# Patient Record
Sex: Female | Born: 1945 | Race: White | Hispanic: No | Marital: Married | Smoking: Former smoker
Health system: Southern US, Community
[De-identification: ages and names within clinical notes are randomized; demographics above are authoritative.]

## PROBLEM LIST (undated history)

## (undated) DIAGNOSIS — D649 Anemia, unspecified: Secondary | ICD-10-CM

## (undated) DIAGNOSIS — S42309A Unspecified fracture of shaft of humerus, unspecified arm, initial encounter for closed fracture: Secondary | ICD-10-CM

## (undated) DIAGNOSIS — J45909 Unspecified asthma, uncomplicated: Secondary | ICD-10-CM

## (undated) DIAGNOSIS — Z9289 Personal history of other medical treatment: Secondary | ICD-10-CM

## (undated) DIAGNOSIS — I10 Essential (primary) hypertension: Secondary | ICD-10-CM

## (undated) DIAGNOSIS — S92901A Unspecified fracture of right foot, initial encounter for closed fracture: Secondary | ICD-10-CM

## (undated) DIAGNOSIS — R0600 Dyspnea, unspecified: Secondary | ICD-10-CM

## (undated) HISTORY — PX: COLONOSCOPY: SHX174

## (undated) HISTORY — PX: WRIST ARTHROSCOPY: SUR100

## (undated) HISTORY — PX: EYE SURGERY: SHX253

## (undated) HISTORY — PX: LASIK: SHX215

## (undated) HISTORY — PX: MENISCUS REPAIR: SHX5179

## (undated) HISTORY — PX: ABDOMINAL HYSTERECTOMY: SHX81

---

## 1978-05-24 HISTORY — PX: ANKLE FRACTURE SURGERY: SHX122

## 2001-10-30 ENCOUNTER — Ambulatory Visit (HOSPITAL_BASED_OUTPATIENT_CLINIC_OR_DEPARTMENT_OTHER): Admission: RE | Admit: 2001-10-30 | Discharge: 2001-10-30 | Payer: Self-pay | Admitting: Orthopedic Surgery

## 2018-02-02 ENCOUNTER — Other Ambulatory Visit: Payer: Self-pay | Admitting: Orthopedic Surgery

## 2018-02-09 NOTE — Patient Instructions (Addendum)
Denise Ortega  02/09/2018   Your procedure is scheduled on: 02-22-18   [facility]tr[facility]    Report to admitting at 10:45 AM    Call this number if you have problems the morning of surgery [phone]     Remember: Do not eat food or drink liquids :After Midnight. You may have a Clear Liquid Diet from Midnight until 7:15 AM. After 7:15 AM, nothing until after surgery.    BRUSH YOUR TEETH MORNING OF SURGERY AND RINSE YOUR MOUTH OUT, NO CHEWING GUM CANDY OR MINTS.      CLEAR LIQUID DIET   Foods Allowed                                                                     Foods Excluded  Coffee and tea, regular and decaf                             liquids that you cannot  Plain Jell-O in any flavor                                             see through such as: Fruit ices (not with fruit pulp)                                     milk, soups, orange juice  Iced Popsicles                                    All solid food Carbonated beverages, regular and diet                                    Cranberry, grape and apple juices Sports drinks like Gatorade Lightly seasoned clear broth or consume(fat free) Sugar, honey syrup  Sample Menu Breakfast                                Lunch                                     Supper Cranberry juice                    Beef broth                            Chicken broth Jell-O                                     Grape juice  Apple juice Coffee or tea                        Jell-O                                      Popsicle                                                Coffee or tea                        Coffee or tea  _____________________________________________________________________    Take these medicines the morning of surgery with A SIP OF WATER: You may bring and use your inhaler and eyedrops as needed.                               You may not have any metal  on your body including hair pins and              piercings  Do not wear jewelry, make-up, lotions, powders or perfumes, deodorant             Do not wear nail polish.  Do not shave 48 hours prior to surgery.               Do not bring valuables to the hospital. [facility] IS NOT             RESPONSIBLE   FOR VALUABLES.  Contacts, dentures or bridgework may not be worn into surgery.  Leave suitcase in the car. After surgery it may be brought to your room.  :  Special Instructions: N/A              Please read over the following fact sheets you were given: _____________________________________________________________________ [facility] - Preparing for Surgery  Before surgery, you can play an important role.  Because skin is not sterile, your skin needs to be as free of germs as possible.  You can reduce the number of germs on your skin by washing with CHG (chlorahexidine gluconate) soap before surgery.  CHG is an antiseptic cleaner which kills germs and bonds with the skin to continue killing germs even after washing. Please DO NOT use if you have an allergy to CHG or antibacterial soaps.  If your skin becomes reddened/irritated stop using the CHG and inform your nurse when you arrive at Short Stay. Do not shave (including legs and underarms) for at least 48 hours prior to the first CHG shower.  You may shave your face/neck. Please follow these instructions carefully:  1.  Shower with CHG Soap the night before surgery and the  morning of Surgery.  2.  If you choose to wash your hair, wash your hair first as usual with your  normal  shampoo.  3.  After you shampoo, rinse your hair and body thoroughly to remove the  shampoo.                           4.  Use CHG as you would any other liquid soap.  You can apply chg directly  to the skin and wash                       Gently with a scrungie or clean washcloth.  5.  Apply the CHG Soap to your body ONLY FROM THE NECK DOWN.   Do not use on face/  open                           Wound or open sores. Avoid contact with eyes, ears mouth and genitals (private parts).                       Wash face,  Genitals (private parts) with your normal soap.             6.  Wash thoroughly, paying special attention to the area where your surgery  will be performed.  7.  Thoroughly rinse your body with warm water from the neck down.  8.  DO NOT shower/wash with your normal soap after using and rinsing off  the CHG Soap.                9.  Pat yourself dry with a clean towel.            10.  Wear clean pajamas.            11.  Place clean sheets on your bed the night of your first shower and do not  sleep with pets. Day of Surgery : Do not apply any lotions/deodorants the morning of surgery.  Please wear clean clothes to the hospital/surgery center.  FAILURE TO FOLLOW THESE INSTRUCTIONS MAY RESULT IN THE CANCELLATION OF YOUR SURGERY PATIENT SIGNATURE_________________________________  NURSE SIGNATURE__________________________________  ________________________________________________________________________   Rogelia Mire  An incentive spirometer is a tool that can help keep your lungs clear and active. This tool measures how well you are filling your lungs with each breath. Taking long deep breaths may help reverse or decrease the chance of developing breathing (pulmonary) problems (especially infection) following:  A long period of time when you are unable to move or be active. BEFORE THE PROCEDURE   If the spirometer includes an indicator to show your best effort, your nurse or respiratory therapist will set it to a desired goal.  If possible, sit up straight or lean slightly forward. Try not to slouch.  Hold the incentive spirometer in an upright position. INSTRUCTIONS FOR USE  1. Sit on the edge of your bed if possible, or sit up as far as you can in bed or on a chair. 2. Hold the incentive spirometer in an upright  position. 3. Breathe out normally. 4. Place the mouthpiece in your mouth and seal your lips tightly around it. 5. Breathe in slowly and as deeply as possible, raising the piston or the ball toward the top of the column. 6. Hold your breath for 3-5 seconds or for as long as possible. Allow the piston or ball to fall to the bottom of the column. 7. Remove the mouthpiece from your mouth and breathe out normally. 8. Rest for a few seconds and repeat Steps 1 through 7 at least 10 times every 1-2 hours when you are awake. Take your time and take a few normal breaths between deep breaths. 9. The spirometer may include an indicator to show your best effort. Use the indicator as a goal to work toward during each repetition. 10. After  each set of 10 deep breaths, practice coughing to be sure your lungs are clear. If you have an incision (the cut made at the time of surgery), support your incision when coughing by placing a pillow or rolled up towels firmly against it. Once you are able to get out of bed, walk around indoors and cough well. You may stop using the incentive spirometer when instructed by your caregiver.  RISKS AND COMPLICATIONS  Take your time so you do not get dizzy or light-headed.  If you are in pain, you may need to take or ask for pain medication before doing incentive spirometry. It is harder to take a deep breath if you are having pain. AFTER USE  Rest and breathe slowly and easily.  It can be helpful to keep track of a log of your progress. Your caregiver can provide you with a simple table to help with this. If you are using the spirometer at home, follow these instructions: [place] IF:   You are having difficultly using the spirometer.  You have trouble using the spirometer as often as instructed.  Your pain medication is not giving enough relief while using the spirometer.  You develop fever of 100.5 F (38.1 C) or higher. SEEK IMMEDIATE MEDICAL CARE IF:    You cough up bloody sputum that had not been present before.  You develop fever of 102 F (38.9 C) or greater.  You develop worsening pain at or near the incision site. MAKE SURE YOU:   Understand these instructions.  Will watch your condition.  Will get help right away if you are not doing well or get worse. Document Released: 09/20/2006 Document Revised: 08/02/2011 Document Reviewed: 11/21/2006 Exit[employer] Patient Information 2014 Exit[employer], [place].   ________________________________________________________________________  WHAT IS A BLOOD TRANSFUSION? Blood Transfusion Information  A transfusion is the replacement of blood or some of its parts. Blood is made up of multiple cells which provide different functions.  Red blood cells carry oxygen and are used for blood loss replacement.  White blood cells fight against infection.  Platelets control bleeding.  Plasma helps clot blood.  Other blood products are available for specialized needs, such as hemophilia or other clotting disorders. BEFORE THE TRANSFUSION  Who gives blood for transfusions?   Healthy volunteers who are fully evaluated to make sure their blood is safe. This is blood bank blood. Transfusion therapy is the safest it has ever been in the practice of medicine. Before blood is taken from a donor, a complete history is taken to make sure that person has no history of diseases nor engages in risky social behavior (examples are intravenous drug use or sexual activity with multiple partners). The donor's travel history is screened to minimize risk of transmitting infections, such as malaria. The donated blood is tested for signs of infectious diseases, such as HIV and hepatitis. The blood is then tested to be sure it is compatible with you in order to minimize the chance of a transfusion reaction. If you or a relative donates blood, this is often done in anticipation of surgery and is not appropriate for emergency  situations. It takes many days to process the donated blood. RISKS AND COMPLICATIONS Although transfusion therapy is very safe and saves many lives, the main dangers of transfusion include:   Getting an infectious disease.  Developing a transfusion reaction. This is an allergic reaction to something in the blood you were given. Every precaution is taken to prevent this. The decision to  have a blood transfusion has been considered carefully by your caregiver before blood is given. Blood is not given unless the benefits outweigh the risks. AFTER THE TRANSFUSION  Right after receiving a blood transfusion, you will usually feel much better and more energetic. This is especially true if your red blood cells have gotten low (anemic). The transfusion raises the level of the red blood cells which carry oxygen, and this usually causes an energy increase.  The nurse administering the transfusion will monitor you carefully for complications. HOME CARE INSTRUCTIONS  No special instructions are needed after a transfusion. You may find your energy is better. Speak with your caregiver about any limitations on activity for underlying diseases you may have. SEEK MEDICAL CARE IF:   Your condition is not improving after your transfusion.  You develop redness or irritation at the intravenous (IV) site. SEEK IMMEDIATE MEDICAL CARE IF:  Any of the following symptoms occur over the next 12 hours:  Shaking chills.  You have a temperature by mouth above 102 F (38.9 C), not controlled by medicine.  Chest, back, or muscle pain.  People around you feel you are not acting correctly or are confused.  Shortness of breath or difficulty breathing.  Dizziness and fainting.  You get a rash or develop hives.  You have a decrease in urine output.  Your urine turns a dark color or changes to pink, red, or brown. Any of the following symptoms occur over the next 10 days:  You have a temperature by mouth above  102 F (38.9 C), not controlled by medicine.  Shortness of breath.  Weakness after normal activity.  The white part of the eye turns yellow (jaundice).  You have a decrease in the amount of urine or are urinating less often.  Your urine turns a dark color or changes to pink, red, or brown. Document Released: 05/07/2000 Document Revised: 08/02/2011 Document Reviewed: 12/25/2007 [facility] Patient Information 2014 [place], [place].  _______________________________________________________________________

## 2018-02-14 ENCOUNTER — Encounter (HOSPITAL_COMMUNITY)
Admission: RE | Admit: 2018-02-14 | Discharge: 2018-02-14 | Disposition: A | Discharge status: Home / Self Care | Payer: Medicare Other | Source: Ambulatory Visit | Attending: Orthopedic Surgery | Admitting: Orthopedic Surgery

## 2018-02-14 ENCOUNTER — Ambulatory Visit (HOSPITAL_COMMUNITY)
Admission: RE | Admit: 2018-02-14 | Discharge: 2018-02-14 | Disposition: A | Discharge status: Home / Self Care | Payer: Medicare Other | Source: Ambulatory Visit | Attending: Orthopedic Surgery | Admitting: Orthopedic Surgery

## 2018-02-14 ENCOUNTER — Other Ambulatory Visit: Payer: Self-pay

## 2018-02-14 ENCOUNTER — Encounter (HOSPITAL_COMMUNITY): Payer: Self-pay | Admitting: *Deleted

## 2018-02-14 DIAGNOSIS — Z01818 Encounter for other preprocedural examination: Secondary | ICD-10-CM

## 2018-02-14 HISTORY — DX: Dyspnea, unspecified: R06.00

## 2018-02-14 HISTORY — DX: Unspecified asthma, uncomplicated: J45.909

## 2018-02-14 HISTORY — DX: Anemia, unspecified: D64.9

## 2018-02-14 HISTORY — DX: Essential (primary) hypertension: I10

## 2018-02-14 LAB — BASIC METABOLIC PANEL
Anion gap: 8 (ref 5–15)
BUN: 21 mg/dL (ref 8–23)
CHLORIDE: 108 mmol/L (ref 98–111)
CO2: 28 mmol/L (ref 22–32)
Calcium: 9.6 mg/dL (ref 8.9–10.3)
Creatinine, Ser: 0.78 mg/dL (ref 0.44–1.00)
GFR calc Af Amer: >60 mL/min (ref >60)
GFR calc non Af Amer: >60 mL/min (ref >60)
GLUCOSE: 102 mg/dL — AB (ref 70–99)
Potassium: 3.8 mmol/L (ref 3.5–5.1)
Sodium: 144 mmol/L (ref 135–145)

## 2018-02-14 LAB — URINALYSIS, ROUTINE W REFLEX MICROSCOPIC
Bilirubin Urine: NEGATIVE
GLUCOSE, UA: NEGATIVE mg/dL
Ketones, ur: NEGATIVE mg/dL
Leukocytes, UA: NEGATIVE
NITRITE: NEGATIVE
PH: 6 (ref 5.0–8.0)
Protein, ur: NEGATIVE mg/dL
Specific Gravity, Urine: 1.023 (ref 1.005–1.030)

## 2018-02-14 LAB — CBC WITH DIFFERENTIAL/PLATELET
Basophils Absolute: 0.1 10*3/uL (ref 0.0–0.1)
Basophils Relative: 1 %
EOS PCT: 8 %
Eosinophils Absolute: 0.5 10*3/uL (ref 0.0–0.7)
HCT: 35.5 % — ABNORMAL LOW (ref 36.0–46.0)
Hemoglobin: 11.6 g/dL — ABNORMAL LOW (ref 12.0–15.0)
LYMPHS ABS: 2.1 10*3/uL (ref 0.7–4.0)
LYMPHS PCT: 31 %
MCH: 28 pg (ref 26.0–34.0)
MCHC: 32.7 g/dL (ref 30.0–36.0)
MCV: 85.7 fL (ref 78.0–100.0)
MONO ABS: 0.5 10*3/uL (ref 0.1–1.0)
MONOS PCT: 7 %
Neutro Abs: 3.6 10*3/uL (ref 1.7–7.7)
Neutrophils Relative %: 53 %
Platelets: 292 10*3/uL (ref 150–400)
RBC: 4.14 MIL/uL (ref 3.87–5.11)
RDW: 14.4 % (ref 11.5–15.5)
WBC: 6.7 10*3/uL (ref 4.0–10.5)

## 2018-02-14 LAB — SURGICAL PCR SCREEN
MRSA, PCR: NEGATIVE
Staphylococcus aureus: POSITIVE — AB

## 2018-02-14 LAB — ABO/RH: ABO/RH(D): A POS

## 2018-02-14 LAB — PROTIME-INR
INR: 0.95
Prothrombin Time: 12.6 seconds (ref 11.4–15.2)

## 2018-02-14 LAB — APTT: aPTT: 30 seconds (ref 24–36)

## 2018-02-16 ENCOUNTER — Other Ambulatory Visit: Payer: Self-pay | Admitting: Orthopedic Surgery

## 2018-02-16 NOTE — Care Plan (Signed)
Patient lives in [place] with family. She plans to discharge back there. I have arranged for the [facility] there to provide her HHPT and she will have her OPPT at [facility] OP. She is aware and agreeable to all arrangements.   Thanks

## 2018-02-21 DIAGNOSIS — M1711 Unilateral primary osteoarthritis, right knee: Secondary | ICD-10-CM | POA: Diagnosis present

## 2018-02-21 MED ORDER — TRANEXAMIC ACID 1000 MG/10ML IV SOLN
2000.0000 mg | INTRAVENOUS | Status: DC
  Filled 2018-02-21: qty 20

## 2018-02-21 MED ORDER — BUPIVACAINE LIPOSOME 1.3 % IJ SUSP
20.0000 mL | INTRAMUSCULAR | Status: DC
  Filled 2018-02-21: qty 20

## 2018-02-21 NOTE — H&P (Signed)
TOTAL KNEE ADMISSION H&P  Patient is being admitted for right total knee arthroplasty.  Subjective:  Chief Complaint:right knee pain.  HPI: Denise Ortega, 72 y.o. female, has a history of pain and functional disability in the right knee due to arthritis and has failed non-surgical conservative treatments for greater than 12 weeks to includeNSAID's and/or analgesics, corticosteriod injections, flexibility and strengthening excercises, weight reduction as appropriate and activity modification.  Onset of symptoms was gradual, starting 3 years ago with gradually worsening course since that time. The patient noted no past surgery on the right knee(s).  Patient currently rates pain in the right knee(s) at 10 out of 10 with activity. Patient has night pain, worsening of pain with activity and weight bearing, pain that interferes with activities of daily living and pain with passive range of motion.  Patient has evidence of joint space narrowing by imaging studies.  There is no active infection.  There are no active problems to display for this patient.  Past Medical History:  Diagnosis Date  . Anemia   . Asthma   . Dyspnea   . Hypertension     Past Surgical History:  Procedure Laterality Date  . ANKLE FRACTURE SURGERY Right 1980  . EYE SURGERY Bilateral    Cataract  . MENISCUS REPAIR Bilateral   . WRIST ARTHROSCOPY      No current facility-administered medications for this encounter.    Current Outpatient Medications  Medication Sig Dispense Refill Last Dose  . acetaminophen (TYLENOL) 325 MG tablet Take 975 mg by mouth every 6 (six) hours as needed (for pain.).     Marland Kitchen albuterol (PROVENTIL HFA;VENTOLIN HFA) 108 (90 Base) MCG/ACT inhaler Inhale 1-2 puffs into the lungs every 6 (six) hours as needed for wheezing or shortness of breath.     . Calcium-Magnesium-Vitamin D (CALCIUM 1200+D3 PO) Take 1 tablet by mouth daily.     . cyanocobalamin (,VITAMIN B-12,) 1000 MCG/ML injection Inject 1,000 mcg  into the skin every 30 (thirty) days.     Marland Kitchen ibuprofen (ADVIL,MOTRIN) 200 MG tablet Take 600 mg by mouth 2 (two) times daily as needed (for pain.).     . Iron-Vitamins (GERITOL TONIC PO) Take 15 mLs by mouth once a week. Geritol Tonic with Ferrex     . polyvinyl alcohol (LUBRICANT DROPS) 1.4 % ophthalmic solution Place 1-2 drops into both eyes as needed for dry eyes.     Marland Kitchen triamterene-hydrochlorothiazide (MAXZIDE-25) 37.5-25 MG tablet Take 1 tablet by mouth daily.  2   . aspirin 81 MG chewable tablet Chew 81 mg by mouth daily.      Allergies  Allergen Reactions  . Ativan [Lorazepam] Other (See Comments)    Mental disturbances.    Social History   Tobacco Use  . Smoking status: Former Games developer  . Smokeless tobacco: Never Used  . Tobacco comment: During  'teen' years, 1 cigarette a day  Substance Use Topics  . Alcohol use: Never    Frequency: Never    No family history on file.   Review of Systems  Musculoskeletal: Positive for joint pain.  All other systems reviewed and are negative.   Objective:  Physical Exam  Constitutional: She is oriented to person, place, and time. She appears well-developed and well-nourished.  HENT:  Head: Normocephalic and atraumatic.  Eyes: Pupils are equal, round, and reactive to light.  Neck: Normal range of motion. Neck supple.  Cardiovascular: Intact distal pulses.  Respiratory: Effort normal.  Musculoskeletal: She exhibits tenderness.  Both  knees have 5 varus deformities and 5 flexion contractures.  The right knee flexes to 120.  The left knee to 130 tender along the medial joint lines trace effusion of both knees.  Neurovascular intact distally.  The skin is intact.  Normal pulses, normal sensation of the toes, which are pink and well-perfused.  Neurological: She is alert and oriented to person, place, and time.  Skin: Skin is warm and dry.  Psychiatric: She has a normal mood and affect. Her behavior is normal. Judgment and thought content  normal.    Vital signs in last 24 hours:    Labs:   Estimated body mass index is 28.54 kg/m as calculated from the following:   Height as of 02/14/18: 5\' 3"  (1.6 m).   Weight as of 02/14/18: 73.1 kg.   Imaging Review Plain radiographs demonstrate  AP, Rosenberg, lateral and sunrise x-rays show end-stage arthritis of both knees, bone-on-bone to the medial compartment, lateral subluxation of right greater than left knee.  Peripheral osteophytes are also present.   Preoperative templating of the joint replacement has been completed, documented, and submitted to the Operating Room personnel in order to optimize intra-operative equipment management.   Anticipated LOS equal to or greater than 2 midnights due to - Age 63 and older with one or more of the following:  - Obesity  - Expected need for hospital services (PT, OT, Nursing) required for safe  discharge  - Anticipated need for postoperative skilled nursing care or inpatient rehab  - Active co-morbidities: Anemia      Assessment/Plan:  End stage arthritis, right knee   The patient history, physical examination, clinical judgment of the provider and imaging studies are consistent with end stage degenerative joint disease of the right knee(s) and total knee arthroplasty is deemed medically necessary. The treatment options including medical management, injection therapy arthroscopy and arthroplasty were discussed at length. The risks and benefits of total knee arthroplasty were presented and reviewed. The risks due to aseptic loosening, infection, stiffness, patella tracking problems, thromboembolic complications and other imponderables were discussed. The patient acknowledged the explanation, agreed to proceed with the plan and consent was signed. Patient is being admitted for inpatient treatment for surgery, pain control, PT, OT, prophylactic antibiotics, VTE prophylaxis, progressive ambulation and ADL's and discharge planning. The  patient is planning to be discharged home with home health services.

## 2018-02-21 NOTE — Progress Notes (Signed)
Received a note that patient wanted a call back.  Called patient and she stated she had spoken with Dr Turner Daniels office and had question answered and had no further questions.

## 2018-02-22 ENCOUNTER — Other Ambulatory Visit: Payer: Self-pay

## 2018-02-22 ENCOUNTER — Ambulatory Visit (HOSPITAL_COMMUNITY)
Admission: RE | Admit: 2018-02-22 | Discharge: 2018-02-24 | Disposition: A | Discharge status: Home / Self Care | Payer: Medicare Other | Source: Ambulatory Visit | Attending: Orthopedic Surgery | Admitting: Orthopedic Surgery

## 2018-02-22 ENCOUNTER — Encounter (HOSPITAL_COMMUNITY): Payer: Self-pay | Admitting: General Practice

## 2018-02-22 ENCOUNTER — Ambulatory Visit (HOSPITAL_COMMUNITY): Payer: Medicare Other | Admitting: Anesthesiology

## 2018-02-22 ENCOUNTER — Encounter (HOSPITAL_COMMUNITY)
Admission: RE | Disposition: A | Discharge status: Home / Self Care | Payer: Self-pay | Source: Ambulatory Visit | Attending: Orthopedic Surgery

## 2018-02-22 DIAGNOSIS — M1711 Unilateral primary osteoarthritis, right knee: Secondary | ICD-10-CM | POA: Diagnosis present

## 2018-02-22 DIAGNOSIS — Z7982 Long term (current) use of aspirin: Secondary | ICD-10-CM | POA: Insufficient documentation

## 2018-02-22 DIAGNOSIS — Z888 Allergy status to other drugs, medicaments and biological substances status: Secondary | ICD-10-CM | POA: Insufficient documentation

## 2018-02-22 DIAGNOSIS — J45909 Unspecified asthma, uncomplicated: Secondary | ICD-10-CM | POA: Diagnosis not present

## 2018-02-22 DIAGNOSIS — I10 Essential (primary) hypertension: Secondary | ICD-10-CM | POA: Diagnosis not present

## 2018-02-22 DIAGNOSIS — Z87891 Personal history of nicotine dependence: Secondary | ICD-10-CM | POA: Insufficient documentation

## 2018-02-22 DIAGNOSIS — Z79899 Other long term (current) drug therapy: Secondary | ICD-10-CM | POA: Insufficient documentation

## 2018-02-22 HISTORY — PX: TOTAL KNEE ARTHROPLASTY: SHX125

## 2018-02-22 LAB — TYPE AND SCREEN
ABO/RH(D): A POS
ANTIBODY SCREEN: NEGATIVE

## 2018-02-22 SURGERY — ARTHROPLASTY, KNEE, TOTAL
Anesthesia: Monitor Anesthesia Care | Site: Knee | Laterality: Right

## 2018-02-22 MED ORDER — SODIUM CHLORIDE 0.9 % IR SOLN
Status: DC | PRN
  Administered 2018-02-22: 1000 mL

## 2018-02-22 MED ORDER — TRANEXAMIC ACID 1000 MG/10ML IV SOLN
1000.0000 mg | INTRAVENOUS | Status: AC
  Administered 2018-02-22: 1000 mg via INTRAVENOUS
  Filled 2018-02-22: qty 10

## 2018-02-22 MED ORDER — FENTANYL CITRATE (PF) 100 MCG/2ML IJ SOLN: 25.0000 ug | INTRAMUSCULAR | Status: DC | PRN

## 2018-02-22 MED ORDER — PANTOPRAZOLE SODIUM 40 MG PO TBEC
40.0000 mg | DELAYED_RELEASE_TABLET | Freq: Every day | ORAL | Status: DC
  Administered 2018-02-22 – 2018-02-24 (×3): 40 mg via ORAL
  Filled 2018-02-22 (×3): qty 1

## 2018-02-22 MED ORDER — ALUM & MAG HYDROXIDE-SIMETH 200-200-20 MG/5ML PO SUSP: 30.0000 mL | ORAL | Status: DC | PRN

## 2018-02-22 MED ORDER — HYDROMORPHONE HCL 1 MG/ML IJ SOLN
0.5000 mg | INTRAMUSCULAR | Status: DC | PRN
  Filled 2018-02-22: qty 1

## 2018-02-22 MED ORDER — TRANEXAMIC ACID 1000 MG/10ML IV SOLN
1000.0000 mg | Freq: Once | INTRAVENOUS | Status: AC
  Administered 2018-02-22: 1000 mg via INTRAVENOUS
  Filled 2018-02-22: qty 10
  Filled 2018-02-22: qty 1000

## 2018-02-22 MED ORDER — PROPOFOL 10 MG/ML IV BOLUS
INTRAVENOUS | Status: AC
  Filled 2018-02-22: qty 20

## 2018-02-22 MED ORDER — CHLORHEXIDINE GLUCONATE 4 % EX LIQD: 60.0000 mL | Freq: Once | CUTANEOUS | Status: DC

## 2018-02-22 MED ORDER — OXYCODONE HCL 5 MG PO TABS
5.0000 mg | ORAL_TABLET | ORAL | Status: DC | PRN
  Administered 2018-02-22: 10 mg via ORAL
  Administered 2018-02-22 (×2): 5 mg via ORAL
  Administered 2018-02-23: 10 mg via ORAL
  Filled 2018-02-22: qty 1
  Filled 2018-02-22: qty 2
  Filled 2018-02-22: qty 1
  Filled 2018-02-22: qty 2
  Filled 2018-02-22: qty 1

## 2018-02-22 MED ORDER — ONDANSETRON HCL 4 MG PO TABS
4.0000 mg | ORAL_TABLET | Freq: Four times a day (QID) | ORAL | Status: DC | PRN
  Administered 2018-02-23: 4 mg via ORAL
  Filled 2018-02-22 (×2): qty 1

## 2018-02-22 MED ORDER — GABAPENTIN 300 MG PO CAPS
300.0000 mg | ORAL_CAPSULE | Freq: Three times a day (TID) | ORAL | Status: DC
  Administered 2018-02-22 – 2018-02-24 (×5): 300 mg via ORAL
  Filled 2018-02-22 (×5): qty 1

## 2018-02-22 MED ORDER — ASPIRIN 81 MG PO CHEW
81.0000 mg | CHEWABLE_TABLET | Freq: Two times a day (BID) | ORAL | Status: DC
  Administered 2018-02-22 – 2018-02-24 (×4): 81 mg via ORAL
  Filled 2018-02-22 (×4): qty 1

## 2018-02-22 MED ORDER — CELECOXIB 200 MG PO CAPS
200.0000 mg | ORAL_CAPSULE | Freq: Two times a day (BID) | ORAL | Status: DC
  Administered 2018-02-22 – 2018-02-24 (×4): 200 mg via ORAL
  Filled 2018-02-22 (×5): qty 1

## 2018-02-22 MED ORDER — MEPERIDINE HCL 50 MG/ML IJ SOLN: 6.2500 mg | INTRAMUSCULAR | Status: DC | PRN

## 2018-02-22 MED ORDER — ROPIVACAINE HCL 7.5 MG/ML IJ SOLN
INTRAMUSCULAR | Status: DC | PRN
  Administered 2018-02-22: 25 mL via PERINEURAL

## 2018-02-22 MED ORDER — SODIUM CHLORIDE 0.9 % IJ SOLN
INTRAMUSCULAR | Status: DC | PRN
  Administered 2018-02-22: 50 mL

## 2018-02-22 MED ORDER — EPHEDRINE 5 MG/ML INJ
INTRAVENOUS | Status: AC
  Filled 2018-02-22: qty 10

## 2018-02-22 MED ORDER — SODIUM CHLORIDE 0.9 % IJ SOLN
INTRAMUSCULAR | Status: AC
  Filled 2018-02-22: qty 50

## 2018-02-22 MED ORDER — ZOLPIDEM TARTRATE 5 MG PO TABS: 5.0000 mg | ORAL_TABLET | Freq: Every evening | ORAL | Status: DC | PRN

## 2018-02-22 MED ORDER — PROPOFOL 500 MG/50ML IV EMUL
INTRAVENOUS | Status: DC | PRN
  Administered 2018-02-22: 100 ug/kg/min via INTRAVENOUS

## 2018-02-22 MED ORDER — ONDANSETRON HCL 4 MG/2ML IJ SOLN
4.0000 mg | Freq: Four times a day (QID) | INTRAMUSCULAR | Status: DC | PRN
  Administered 2018-02-22 – 2018-02-23 (×3): 4 mg via INTRAVENOUS
  Filled 2018-02-22 (×3): qty 2

## 2018-02-22 MED ORDER — FENTANYL CITRATE (PF) 100 MCG/2ML IJ SOLN
50.0000 ug | Freq: Once | INTRAMUSCULAR | Status: AC
  Administered 2018-02-22: 100 ug via INTRAVENOUS

## 2018-02-22 MED ORDER — MIDAZOLAM HCL 2 MG/2ML IJ SOLN
INTRAMUSCULAR | Status: AC
  Administered 2018-02-22 (×2): 1 mg
  Filled 2018-02-22: qty 2

## 2018-02-22 MED ORDER — BISACODYL 5 MG PO TBEC: 5.0000 mg | DELAYED_RELEASE_TABLET | Freq: Every day | ORAL | Status: DC | PRN

## 2018-02-22 MED ORDER — MENTHOL 3 MG MT LOZG: 1.0000 | LOZENGE | OROMUCOSAL | Status: DC | PRN

## 2018-02-22 MED ORDER — ASPIRIN EC 81 MG PO TBEC: 81.0000 mg | DELAYED_RELEASE_TABLET | Freq: Two times a day (BID) | ORAL | 0 refills | Status: AC

## 2018-02-22 MED ORDER — KCL IN DEXTROSE-NACL 20-5-0.45 MEQ/L-%-% IV SOLN
INTRAVENOUS | Status: DC
  Administered 2018-02-22 – 2018-02-23 (×2): via INTRAVENOUS
  Filled 2018-02-22 (×3): qty 1000

## 2018-02-22 MED ORDER — ACETAMINOPHEN 325 MG PO TABS: 325.0000 mg | ORAL_TABLET | Freq: Four times a day (QID) | ORAL | Status: DC | PRN

## 2018-02-22 MED ORDER — BUPIVACAINE LIPOSOME 1.3 % IJ SUSP
INTRAMUSCULAR | Status: DC | PRN
  Administered 2018-02-22: 20 mL

## 2018-02-22 MED ORDER — EPHEDRINE SULFATE-NACL 50-0.9 MG/10ML-% IV SOSY
PREFILLED_SYRINGE | INTRAVENOUS | Status: DC | PRN
  Administered 2018-02-22 (×3): 10 mg via INTRAVENOUS

## 2018-02-22 MED ORDER — TRANEXAMIC ACID 1000 MG/10ML IV SOLN
INTRAVENOUS | Status: DC | PRN
  Administered 2018-02-22: 2000 mg via TOPICAL

## 2018-02-22 MED ORDER — METHOCARBAMOL 500 MG IVPB - SIMPLE MED
500.0000 mg | Freq: Four times a day (QID) | INTRAVENOUS | Status: DC | PRN
  Filled 2018-02-22: qty 50

## 2018-02-22 MED ORDER — CEFAZOLIN SODIUM-DEXTROSE 2-4 GM/100ML-% IV SOLN
2.0000 g | INTRAVENOUS | Status: AC
  Administered 2018-02-22: 2 g via INTRAVENOUS
  Filled 2018-02-22: qty 100

## 2018-02-22 MED ORDER — BUPIVACAINE IN DEXTROSE 0.75-8.25 % IT SOLN
INTRATHECAL | Status: DC | PRN
  Administered 2018-02-22: 1.8 mL via INTRATHECAL

## 2018-02-22 MED ORDER — DOCUSATE SODIUM 100 MG PO CAPS
100.0000 mg | ORAL_CAPSULE | Freq: Two times a day (BID) | ORAL | Status: DC
  Administered 2018-02-22 – 2018-02-24 (×4): 100 mg via ORAL
  Filled 2018-02-22 (×4): qty 1

## 2018-02-22 MED ORDER — PROPOFOL 10 MG/ML IV BOLUS
INTRAVENOUS | Status: AC
  Filled 2018-02-22: qty 40

## 2018-02-22 MED ORDER — ONDANSETRON HCL 4 MG/2ML IJ SOLN
INTRAMUSCULAR | Status: AC
  Filled 2018-02-22: qty 2

## 2018-02-22 MED ORDER — OXYCODONE-ACETAMINOPHEN 5-325 MG PO TABS: 1.0000 | ORAL_TABLET | ORAL | 0 refills | Status: DC | PRN

## 2018-02-22 MED ORDER — BUPIVACAINE HCL 0.25 % IJ SOLN
INTRAMUSCULAR | Status: DC | PRN
  Administered 2018-02-22: 30 mL

## 2018-02-22 MED ORDER — LACTATED RINGERS IV SOLN
INTRAVENOUS | Status: DC
  Administered 2018-02-22 (×2): via INTRAVENOUS

## 2018-02-22 MED ORDER — TRIAMTERENE-HCTZ 37.5-25 MG PO TABS
1.0000 | ORAL_TABLET | Freq: Every day | ORAL | Status: DC
  Administered 2018-02-22 – 2018-02-24 (×3): 1 via ORAL
  Filled 2018-02-22 (×3): qty 1

## 2018-02-22 MED ORDER — BUPIVACAINE HCL (PF) 0.25 % IJ SOLN
INTRAMUSCULAR | Status: AC
  Filled 2018-02-22: qty 30

## 2018-02-22 MED ORDER — DIPHENHYDRAMINE HCL 12.5 MG/5ML PO ELIX: 12.5000 mg | ORAL_SOLUTION | ORAL | Status: DC | PRN

## 2018-02-22 MED ORDER — METOCLOPRAMIDE HCL 5 MG PO TABS
5.0000 mg | ORAL_TABLET | Freq: Three times a day (TID) | ORAL | Status: DC | PRN
  Administered 2018-02-23: 5 mg via ORAL
  Filled 2018-02-22: qty 1

## 2018-02-22 MED ORDER — POLYETHYLENE GLYCOL 3350 17 G PO PACK: 17.0000 g | PACK | Freq: Every day | ORAL | Status: DC | PRN

## 2018-02-22 MED ORDER — FLEET ENEMA 7-19 GM/118ML RE ENEM: 1.0000 | ENEMA | Freq: Once | RECTAL | Status: DC | PRN

## 2018-02-22 MED ORDER — POLYVINYL ALCOHOL 1.4 % OP SOLN: 1.0000 [drp] | OPHTHALMIC | Status: DC | PRN

## 2018-02-22 MED ORDER — METHOCARBAMOL 500 MG PO TABS
500.0000 mg | ORAL_TABLET | Freq: Four times a day (QID) | ORAL | Status: DC | PRN
  Administered 2018-02-22 – 2018-02-23 (×3): 500 mg via ORAL
  Filled 2018-02-22 (×3): qty 1

## 2018-02-22 MED ORDER — WATER FOR IRRIGATION, STERILE IR SOLN
Status: DC | PRN
  Administered 2018-02-22: 2000 mL

## 2018-02-22 MED ORDER — PHENOL 1.4 % MT LIQD: 1.0000 | OROMUCOSAL | Status: DC | PRN

## 2018-02-22 MED ORDER — FENTANYL CITRATE (PF) 100 MCG/2ML IJ SOLN
INTRAMUSCULAR | Status: AC
  Filled 2018-02-22: qty 2

## 2018-02-22 MED ORDER — TIZANIDINE HCL 2 MG PO TABS: 2.0000 mg | ORAL_TABLET | Freq: Four times a day (QID) | ORAL | 0 refills | Status: DC | PRN

## 2018-02-22 MED ORDER — METOCLOPRAMIDE HCL 5 MG/ML IJ SOLN
5.0000 mg | Freq: Three times a day (TID) | INTRAMUSCULAR | Status: DC | PRN
  Administered 2018-02-22: 5 mg via INTRAVENOUS
  Administered 2018-02-23: 10 mg via INTRAVENOUS
  Filled 2018-02-22 (×2): qty 2

## 2018-02-22 MED ORDER — ALBUTEROL SULFATE (2.5 MG/3ML) 0.083% IN NEBU: 3.0000 mL | INHALATION_SOLUTION | Freq: Four times a day (QID) | RESPIRATORY_TRACT | Status: DC | PRN

## 2018-02-22 SURGICAL SUPPLY — 49 items
ATTUNE MED DOME PAT 41 KNEE (Knees) ×2 IMPLANT
ATTUNE MED DOME PAT 41MM KNEE (Knees) ×1 IMPLANT
ATTUNE PS FEM RT SZ 5 CEM KNEE (Femur) ×3 IMPLANT
ATTUNE PSRP INSR SZ5 5 KNEE (Insert) ×2 IMPLANT
ATTUNE PSRP INSR SZ5 5MM KNEE (Insert) ×1 IMPLANT
BAG DECANTER FOR FLEXI CONT (MISCELLANEOUS) ×3 IMPLANT
BAG ZIPLOCK 12X15 (MISCELLANEOUS) ×3 IMPLANT
BANDAGE ELASTIC 6 VELCRO ST LF (GAUZE/BANDAGES/DRESSINGS) ×3 IMPLANT
BASE TIBIAL ROT PLAT SZ 5 KNEE (Knees) ×1 IMPLANT
BLADE SAG 18X100X1.27 (BLADE) ×3 IMPLANT
BLADE SAW SGTL 11.0X1.19X90.0M (BLADE) IMPLANT
BNDG ELASTIC 6X10 VLCR STRL LF (GAUZE/BANDAGES/DRESSINGS) ×3 IMPLANT
BOWL SMART MIX CTS (DISPOSABLE) ×3 IMPLANT
CEMENT HV SMART SET (Cement) ×6 IMPLANT
COVER SURGICAL LIGHT HANDLE (MISCELLANEOUS) ×3 IMPLANT
CUFF TOURN SGL QUICK 34 (TOURNIQUET CUFF) ×2
CUFF TRNQT CYL 34X4X40X1 (TOURNIQUET CUFF) ×1 IMPLANT
DECANTER SPIKE VIAL GLASS SM (MISCELLANEOUS) ×9 IMPLANT
DRAPE U-SHAPE 47X51 STRL (DRAPES) ×3 IMPLANT
DRSG AQUACEL AG ADV 3.5X10 (GAUZE/BANDAGES/DRESSINGS) ×3 IMPLANT
DURAPREP 26ML APPLICATOR (WOUND CARE) ×3 IMPLANT
ELECT REM PT RETURN 15FT ADLT (MISCELLANEOUS) ×3 IMPLANT
GLOVE BIO SURGEON STRL SZ7.5 (GLOVE) ×3 IMPLANT
GLOVE BIO SURGEON STRL SZ8.5 (GLOVE) ×3 IMPLANT
GLOVE BIOGEL PI IND STRL 8 (GLOVE) ×1 IMPLANT
GLOVE BIOGEL PI IND STRL 9 (GLOVE) ×1 IMPLANT
GLOVE BIOGEL PI INDICATOR 8 (GLOVE) ×2
GLOVE BIOGEL PI INDICATOR 9 (GLOVE) ×2
GOWN STRL REUS W/TWL XL LVL3 (GOWN DISPOSABLE) ×6 IMPLANT
HANDPIECE INTERPULSE COAX TIP (DISPOSABLE) ×2
HOOD PEEL AWAY FLYTE STAYCOOL (MISCELLANEOUS) ×6 IMPLANT
IV KIT MINILOC 20X1 SAFETY (NEEDLE) ×3 IMPLANT
NS IRRIG 1000ML POUR BTL (IV SOLUTION) ×3 IMPLANT
PACK ICE MAXI GEL EZY WRAP (MISCELLANEOUS) ×3 IMPLANT
PACK TOTAL KNEE CUSTOM (KITS) ×3 IMPLANT
POSITIONER SURGICAL ARM (MISCELLANEOUS) ×3 IMPLANT
SET HNDPC FAN SPRY TIP SCT (DISPOSABLE) ×1 IMPLANT
STAPLER VISISTAT 35W (STAPLE) IMPLANT
SUT VIC AB 1 CTX 36 (SUTURE) ×2
SUT VIC AB 1 CTX36XBRD ANBCTR (SUTURE) ×1 IMPLANT
SUT VIC AB 2-0 CT1 27 (SUTURE) ×2
SUT VIC AB 2-0 CT1 TAPERPNT 27 (SUTURE) ×1 IMPLANT
SUT VIC AB 3-0 CT1 27 (SUTURE) ×2
SUT VIC AB 3-0 CT1 TAPERPNT 27 (SUTURE) ×1 IMPLANT
SYR CONTROL 10ML LL (SYRINGE) ×6 IMPLANT
TIBIAL BASE ROT PLAT SZ 5 KNEE (Knees) ×3 IMPLANT
TRAY FOLEY MTR SLVR 16FR STAT (SET/KITS/TRAYS/PACK) ×3 IMPLANT
WATER STERILE IRR 1000ML POUR (IV SOLUTION) ×6 IMPLANT
YANKAUER SUCT BULB TIP 10FT TU (MISCELLANEOUS) ×3 IMPLANT

## 2018-02-22 NOTE — Progress Notes (Signed)
Assisted Dr. Tacy Dura with right adductor canal block. Side rails up, monitors on throughout procedure. See vital signs in flow sheet. Tolerated Procedure well.

## 2018-02-22 NOTE — Discharge Instructions (Signed)

## 2018-02-22 NOTE — Transfer of Care (Signed)
Immediate Anesthesia Transfer of Care Note  Patient: Denise Ortega  Procedure(s) Performed: RIGHT TOTAL KNEE ARTHROPLASTY (Right Knee)  Patient Location: PACU  Anesthesia Type:Regional and Spinal  Level of Consciousness: sedated  Airway & Oxygen Therapy: Patient Spontanous Breathing and Patient connected to face mask oxygen  Post-op Assessment: Report given to RN and Post -op Vital signs reviewed and stable  Post vital signs: Reviewed and stable  Last Vitals:  Vitals Value Taken Time  BP    Temp    Pulse    Resp 17 02/22/2018  2:24 PM  SpO2    Vitals shown include unvalidated device data.  Last Pain:  Vitals:   02/22/18 1026  TempSrc:   PainSc: 6          Complications: No apparent anesthesia complications

## 2018-02-22 NOTE — Anesthesia Postprocedure Evaluation (Signed)
Anesthesia Post Note  Patient: Denise Ortega  Procedure(s) Performed: RIGHT TOTAL KNEE ARTHROPLASTY (Right Knee)     Patient location during evaluation: PACU Anesthesia Type: MAC Level of consciousness: oriented and awake and alert Pain management: pain level controlled Vital Signs Assessment: post-procedure vital signs reviewed and stable Respiratory status: spontaneous breathing, respiratory function stable and patient connected to nasal cannula oxygen Cardiovascular status: blood pressure returned to baseline and stable Postop Assessment: no headache, no backache and no apparent nausea or vomiting Anesthetic complications: no    Last Vitals:  Vitals:   02/22/18 1445 02/22/18 1500  BP: (!) 142/58 (!) 147/60  Pulse: 73 62  Resp: 19 17  Temp:    SpO2: 100% 100%    Last Pain:  Vitals:   02/22/18 1426  TempSrc:   PainSc: 0-No pain                 Greggory Safranek

## 2018-02-22 NOTE — Interval H&P Note (Signed)
History and Physical Interval Note:  02/22/2018 12:31 PM  Denise Ortega  has presented today for surgery, with the diagnosis of RIGHT KNEE OSTEOARTHRITIS  The various methods of treatment have been discussed with the patient and family. After consideration of risks, benefits and other options for treatment, the patient has consented to  Procedure(s): RIGHT TOTAL KNEE ARTHROPLASTY (Right) as a surgical intervention .  The patient's history has been reviewed, patient examined, no change in status, stable for surgery.  I have reviewed the patient's chart and labs.  Questions were answered to the patient's satisfaction.     Nestor Lewandowsky

## 2018-02-22 NOTE — Evaluation (Signed)
Physical Therapy Evaluation Patient Details Name: Denise Ortega MRN: [id number] DOB: 1945-08-14 Today's Date: 02/22/2018   History of Present Illness  72 YO female s/p R TKR on 02/22/18. PMH includes anemia, asthma, dyspnea, HTN, R ankle fracture surgery, cataracts surgery, and bilat meniscus repairs.   Clinical Impression   Pt presents with R knee pain, difficulty performing mobility tasks, decreased R knee ROM, and decreased tolerance for ambulation. Pt also limited by nausea and emesis this session. Pt ambulated room distance with RW with min guard assist. PT to progress mobility as able, and will continue to follow acutely.     Follow Up Recommendations Follow surgeon's recommendation for DC plan and follow-up therapies;Supervision for mobility/OOB(HHPT )    Equipment Recommendations  Rolling walker with 5" wheels;Cane    Recommendations for Other Services       Precautions / Restrictions Precautions Precautions: Fall Restrictions Weight Bearing Restrictions: No Other Position/Activity Restrictions: WBAT       Mobility  Bed Mobility Overal bed mobility: Needs Assistance Bed Mobility: Supine to Sit     Supine to sit: HOB elevated;Min guard     General bed mobility comments: Min guard for safety. Increased time/effort to perform. Once sitting EOB, pt reported feeling nauseous. Pt with 2 bouts of emesis sitting EOB. After this, pt with decreased nausea and needed to use bathroom. Pt refused BSC use, wanted to ambulate to bathroom.   Transfers Overall transfer level: Needs assistance Equipment used: Rolling walker (2 wheeled) Transfers: Sit to/from Stand Sit to Stand: Min assist         General transfer comment: sit to stand x2, once from bed and once from toilet. Min assist for power up and steadying, pt with unsteadiness when rising from toilet.   Ambulation/Gait Ambulation/Gait assistance: Min guard Gait Distance (Feet): 25 Feet Assistive device: Rolling walker  (2 wheeled) Gait Pattern/deviations: Step-to pattern;Decreased stride length;Decreased weight shift to right;Antalgic;Trunk flexed Gait velocity: very decr   General Gait Details: Pt ambulated to bathroom with min guard assist for safety, verbal cuing for sequencing x3 and for placement in RW.   Stairs            Wheelchair Mobility    Modified Rankin (Stroke Patients Only)       Balance Overall balance assessment: Mild deficits observed, not formally tested                                           Pertinent Vitals/Pain Pain Assessment: 0-10 Pain Score: 7  Pain Location: R knee  Pain Descriptors / Indicators: Aching Pain Intervention(s): Limited activity within patient's tolerance;Repositioned;Monitored during session;Ice applied;Premedicated before session    Home Living Family/patient expects to be discharged to:: Private residence Living Arrangements: Spouse/significant other Available Help at Discharge: Family;Available 24 hours/day Type of Home: House Home Access: Stairs to enter Entrance Stairs-Rails: Right Entrance Stairs-Number of Steps: 5 Home Layout: One level Home Equipment: None      Prior Function Level of Independence: Independent               Hand Dominance   Dominant Hand: Right    Extremity/Trunk Assessment   Upper Extremity Assessment Upper Extremity Assessment: Overall WFL for tasks assessed    Lower Extremity Assessment Lower Extremity Assessment: Overall WFL for tasks assessed(able to perform R quad set x10, ankle pumps x15, heel slides with assist x7)  Cervical / Trunk Assessment Cervical / Trunk Assessment: Kyphotic  Communication   Communication: No difficulties  Cognition Arousal/Alertness: Awake/alert Behavior During Therapy: WFL for tasks assessed/performed Overall Cognitive Status: Within Functional Limits for tasks assessed                                        General  Comments      Exercises Total Joint Exercises Ankle Circles/Pumps: AROM;Both;15 reps;Seated Quad Sets: AROM;Right;10 reps;Seated Heel Slides: AAROM;Right;Seated(7 repetitions, limited by pain ) Goniometric ROM: knee AAROM approximately 5-75*, limited by pain.    Assessment/Plan    PT Assessment Patient needs continued PT services  PT Problem List Decreased strength;Pain;Decreased range of motion;Decreased activity tolerance;Decreased knowledge of use of DME;Decreased balance;Decreased safety awareness;Decreased mobility       PT Treatment Interventions DME instruction;Therapeutic activities;Gait training;Therapeutic exercise;Patient/family education;Stair training;Balance training;Functional mobility training    PT Goals (Current goals can be found in the Care Plan section)  Acute Rehab PT Goals PT Goal Formulation: With patient Time For Goal Achievement: 03/01/18 Potential to Achieve Goals: Good    Frequency 7X/week   Barriers to discharge        Co-evaluation               AM-PAC PT "6 Clicks" Daily Activity  Outcome Measure Difficulty turning over in bed (including adjusting bedclothes, sheets and blankets)?: Unable Difficulty moving from lying on back to sitting on the side of the bed? : Unable Difficulty sitting down on and standing up from a chair with arms (e.g., wheelchair, bedside commode, etc,.)?: Unable Help needed moving to and from a bed to chair (including a wheelchair)?: A Little Help needed walking in hospital room?: A Little Help needed climbing 3-5 steps with a railing? : A Little 6 Click Score: 12    End of Session Equipment Utilized During Treatment: Gait belt Activity Tolerance: Other (comment);Patient limited by pain(Pt limited by nausea and emesis) Patient left: in chair;with chair alarm set;with call bell/phone within reach;with family/visitor present Nurse Communication: Mobility status;Other (comment)(emesis) PT Visit Diagnosis: Other  abnormalities of gait and mobility (R26.89);Difficulty in walking, not elsewhere classified (R26.2)    Time: 1610-9604 PT Time Calculation (min) (ACUTE ONLY): 36 min   Charges:   PT Evaluation $PT Eval Low Complexity: 1 Low PT Treatments $Gait Training: 8-22 mins        Nicola Police, PT Acute Rehabilitation Services Pager [phone]  Office [phone]   Nyrah Demos D Despina Hidden 02/22/2018, 7:40 PM

## 2018-02-22 NOTE — Anesthesia Preprocedure Evaluation (Addendum)
Anesthesia Evaluation  Patient identified by MRN, date of birth, ID band Patient awake    Reviewed: Allergy & Precautions, H&P , NPO status , Patient's Chart, lab work & pertinent test results, reviewed documented beta blocker date and time   Airway Mallampati: II  TM Distance: >3 FB Neck ROM: full    Dental no notable dental hx.    Pulmonary neg pulmonary ROS, former smoker,    Pulmonary exam normal breath sounds clear to auscultation       Cardiovascular Exercise Tolerance: Good hypertension, Pt. on medications negative cardio ROS   Rhythm:regular Rate:Normal     Neuro/Psych negative neurological ROS  negative psych ROS   GI/Hepatic negative GI ROS, Neg liver ROS,   Endo/Other  negative endocrine ROS  Renal/GU negative Renal ROS  negative genitourinary   Musculoskeletal   Abdominal   Peds  Hematology negative hematology ROS (+)   Anesthesia Other Findings   Reproductive/Obstetrics negative OB ROS                             Anesthesia Physical Anesthesia Plan  ASA: II  Anesthesia Plan: Spinal and MAC   Post-op Pain Management:  Regional for Post-op pain   Induction:   PONV Risk Score and Plan: 2 and Ondansetron and Treatment may vary due to age or medical condition  Airway Management Planned: Nasal Cannula and Natural Airway  Additional Equipment:   Intra-op Plan:   Post-operative Plan:   Informed Consent: I have reviewed the patients History and Physical, chart, labs and discussed the procedure including the risks, benefits and alternatives for the proposed anesthesia with the patient or authorized representative who has indicated his/her understanding and acceptance.   Dental Advisory Given  Plan Discussed with: CRNA, Anesthesiologist and Surgeon  Anesthesia Plan Comments: (  )       Anesthesia Quick Evaluation

## 2018-02-22 NOTE — Op Note (Signed)
PATIENT ID:      Denise Ortega  MRN:     [id number] DOB/AGE:    Sep 29, 1945 / 72 y.o.       OPERATIVE REPORT    DATE OF PROCEDURE:  02/22/2018       PREOPERATIVE DIAGNOSIS:   RIGHT KNEE OSTEOARTHRITIS      Estimated body mass index is 28.54 kg/m as calculated from the following:   Height as of this encounter: 5\' 3"  (1.6 m).   Weight as of this encounter: 73.1 kg.                                                        POSTOPERATIVE DIAGNOSIS:   right knee oseoarthritis                                                                      PROCEDURE:  Procedure(s): RIGHT TOTAL KNEE ARTHROPLASTY Using DepuyAttune RP implants #5R Femur, #5Tibia, 5 mm Attune RP bearing, 41 Patella     SURGEON: Nestor Lewandowsky    ASSISTANT:   Tomi Likens. [employer]   (Present and scrubbed throughout the case, critical for assistance with exposure, retraction, instrumentation, and closure.)         ANESTHESIA: Spinal, 20cc Exparel, 50cc 0.25% Marcaine  EBL: 300cc  FLUID REPLACEMENT: 1500cc crystalloid  TOURNIQUET TIME: 0 min  Drains: None  Tranexamic Acid: 1gm IV, 2gm topical  COMPLICATIONS:  None         INDICATIONS FOR PROCEDURE: The patient has  RIGHT KNEE OSTEOARTHRITIS, Var deformities, XR shows bone on bone arthritis, lateral subluxation of tibia. Patient has failed all conservative measures including anti-inflammatory medicines, narcotics, attempts at  exercise and weight loss, cortisone injections and viscosupplementation.  Risks and benefits of surgery have been discussed, questions answered.   DESCRIPTION OF PROCEDURE: The patient identified by armband, received  IV antibiotics, in the holding area at [facility]. Patient taken to the operating room, appropriate anesthetic  monitors were attached, and Sinal anesthesia was  induced. Tourniquet  applied high to the operative thigh. Lateral post and foot positioner  applied to the table, the lower extremity was then prepped and draped  in usual  sterile fashion from the toes to the tourniquet. Time-out procedure was performed. We began the operation, with the knee flexed 120 degrees, by making the anterior midline incision starting at handbreadth above the patella going over the patella 1 cm medial to and 4 cm distal to the tibial tubercle. Small bleeders in the skin and the  subcutaneous tissue identified and cauterized. Transverse retinaculum was incised and reflected medially and a medial parapatellar arthrotomy was accomplished. the patella was everted and theprepatellar fat pad resected. The superficial medial collateral  ligament was then elevated from anterior to posterior along the proximal  flare of the tibia and anterior half of the menisci resected. The knee was hyperflexed exposing bone on bone arthritis. Peripheral and notch osteophytes as well as the cruciate ligaments were then resected. We continued to  work our way around posteriorly along the proximal tibia,  and externally  rotated the tibia subluxing it out from underneath the femur. A McHale  retractor was placed through the notch and a lateral Hohmann retractor  placed, and we then drilled through the proximal tibia in line with the  axis of the tibia followed by an intramedullary guide rod and 2-degree  posterior slope cutting guide. The tibial cutting guide, 3 degree posterior sloped, was pinned into place allowing resection of 2 mm of bone medially and 19 mm of bone laterally. Satisfied with the tibial resection, we then  entered the distal femur 2 mm anterior to the PCL origin with the  intramedullary guide rod and applied the distal femoral cutting guide  set at 9 mm, with 5 degrees of valgus. This was pinned along the  epicondylar axis. At this point, the distal femoral cut was accomplished without difficulty. We then sized for a #5L femoral component and pinned the guide in 3 degrees of external rotation. The chamfer cutting guide was pinned into place. The anterior,  posterior, and chamfer cuts were accomplished without difficulty followed by  the Attune RP box cutting guide and the box cut. We also removed posterior osteophytes from the posterior femoral condyles. At this  time, the knee was brought into full extension. We checked our  extension and flexion gaps and found them symmetric for a 5 mm bearing. Distracting in extension with a lamina spreader, the posterior horns of the menisci were removed, and Exparel, diluted to 60 cc, with 20cc NS, and 20cc 0.5% Marcaine,was injected into the capsule and synovium of the knee. The posterior patella cut was accomplished with the 9.5 mm Attune cutting guide, sized for a 41mm dome, and the fixation pegs drilled.The knee  was then once again hyperflexed exposing the proximal tibia. We sized for a # 5 tibial base plate, applied the smokestack and the conical reamer followed by the the Delta fin keel punch. We then hammered into place the Attune RP trial femoral component, drilled the lugs, inserted a  5 mm trial bearing, trial patellar button, and took the knee through range of motion from 0-130 degrees. No thumb pressure was required for patellar Tracking. All trial components were removed, mating surfaces irrigated with pulse lavage, and dried with suction and sponges. 10 cc of the Exparel solution was applied to the cancellus bone of the patella distal femur and proximal tibia.  After waiting 1 minute, the bony surfaces were again, dried with sponges. A double batch of DePuy HV cement with 1500 mg of Zinacef was mixed and applied to all bony metallic mating surfaces except for the posterior condyles of the femur itself. In order, we hammered into place the tibial tray and removed excess cement, the femoral component and removed excess cement. The final Attune RP bearing was inserted, and the knee brought to full extension with compression.  The patellar button was clamped into place, and excess cement  removed. While the  cement cured the wound was irrigated out with normal saline solution pulse lavage. Ligament stability and patellar tracking were checked and found to be excellent. The parapatellar arthrotomy was closed with  running #1 Vicryl suture. The subcutaneous tissue with 0 and 2-0 undyed  Vicryl suture, and the skin with running 3-0 SQ vicryl. A dressing of Xeroform,  4 x 4, dressing sponges, Webril, and Ace wrap applied. The patient  awakened, and taken to recovery room without difficulty.   Nestor Lewandowsky 02/22/2018, 2:12 PM

## 2018-02-22 NOTE — Anesthesia Procedure Notes (Addendum)
Anesthesia Regional Block: Adductor canal block   Pre-Anesthetic Checklist: ,, timeout performed, Correct Patient, Correct Site, Correct Laterality, Correct Procedure, Correct Position, site marked, Risks and benefits discussed,  Surgical consent,  Pre-op evaluation,  At surgeon's request and post-op pain management  Laterality: Right  Prep: chloraprep       Needles:  Injection technique: Single-shot  Needle Type: Echogenic Stimulator Needle     Needle Length: 5cm  Needle Gauge: 22     Additional Needles:   Procedures:, nerve stimulator,,, ultrasound used (permanent image in chart),,,,  Narrative:  Start time: 02/22/2018 12:20 PM End time: 02/22/2018 12:25 PM Injection made incrementally with aspirations every 5 mL.  Performed by: Personally  Anesthesiologist: Bethena Midget, MD  Additional Notes: Functioning IV was confirmed and monitors were applied.  A 50mm 22ga Arrow echogenic stimulator needle was used. Sterile prep and drape,hand hygiene and sterile gloves were used. Ultrasound guidance: relevant anatomy identified, needle position confirmed, local anesthetic spread visualized around nerve(s)., vascular puncture avoided.  Image printed for medical record. Negative aspiration and negative test dose prior to incremental administration of local anesthetic. The patient tolerated the procedure well.

## 2018-02-23 ENCOUNTER — Encounter (HOSPITAL_COMMUNITY): Payer: Self-pay | Admitting: Orthopedic Surgery

## 2018-02-23 DIAGNOSIS — M1711 Unilateral primary osteoarthritis, right knee: Secondary | ICD-10-CM | POA: Diagnosis not present

## 2018-02-23 LAB — BASIC METABOLIC PANEL
Anion gap: 9 (ref 5–15)
BUN: 18 mg/dL (ref 8–23)
CHLORIDE: 103 mmol/L (ref 98–111)
CO2: 26 mmol/L (ref 22–32)
Calcium: 8.5 mg/dL — ABNORMAL LOW (ref 8.9–10.3)
Creatinine, Ser: 0.83 mg/dL (ref 0.44–1.00)
GFR calc Af Amer: >60 mL/min (ref >60)
GFR calc non Af Amer: >60 mL/min (ref >60)
GLUCOSE: 143 mg/dL — AB (ref 70–99)
Potassium: 3.4 mmol/L — ABNORMAL LOW (ref 3.5–5.1)
Sodium: 138 mmol/L (ref 135–145)

## 2018-02-23 LAB — CBC
HCT: 29.8 % — ABNORMAL LOW (ref 36.0–46.0)
Hemoglobin: 9.8 g/dL — ABNORMAL LOW (ref 12.0–15.0)
MCH: 28.2 pg (ref 26.0–34.0)
MCHC: 32.9 g/dL (ref 30.0–36.0)
MCV: 85.9 fL (ref 78.0–100.0)
Platelets: 277 10*3/uL (ref 150–400)
RBC: 3.47 MIL/uL — ABNORMAL LOW (ref 3.87–5.11)
RDW: 14.3 % (ref 11.5–15.5)
WBC: 9.5 10*3/uL (ref 4.0–10.5)

## 2018-02-23 MED ORDER — HYDROMORPHONE HCL 2 MG PO TABS
2.0000 mg | ORAL_TABLET | ORAL | Status: DC | PRN
  Administered 2018-02-23 – 2018-02-24 (×3): 2 mg via ORAL
  Filled 2018-02-23 (×3): qty 1

## 2018-02-23 MED ORDER — HYDROCODONE-ACETAMINOPHEN 5-325 MG PO TABS
0.5000 | ORAL_TABLET | Freq: Four times a day (QID) | ORAL | Status: DC | PRN
  Administered 2018-02-23 (×3): 0.5 via ORAL
  Filled 2018-02-23 (×3): qty 1

## 2018-02-23 MED ORDER — HYDROCODONE-ACETAMINOPHEN 5-325 MG PO TABS: 1.0000 | ORAL_TABLET | Freq: Four times a day (QID) | ORAL | Status: DC | PRN

## 2018-02-23 NOTE — Progress Notes (Signed)
Physical Therapy Treatment Patient Details Name: Denise Ortega MRN: [id number] DOB: [birth year] Today's Date: 02/23/2018    History of Present Illness 72 YO female s/p R TKR on 02/22/18. PMH includes anemia, asthma, dyspnea, HTN, R ankle fracture surgery, cataracts surgery, and bilat meniscus repairs.     PT Comments    POD # 1 pm session Assisted OOB to amb to bathroom then a limited distance in hallway due to nausea.     Follow Up Recommendations  Follow surgeon's recommendation for DC plan and follow-up therapies;Supervision for mobility/OOB     Equipment Recommendations  Rolling walker with 5" wheels;Cane    Recommendations for Other Services       Precautions / Restrictions Precautions Precautions: Fall Restrictions Weight Bearing Restrictions: No Other Position/Activity Restrictions: WBAT     Mobility  Bed Mobility Overal bed mobility: Needs Assistance Bed Mobility: Supine to Sit     Supine to sit: HOB elevated;Min guard     General bed mobility comments: increased time due to nausea and feeling "bad".  Assisted with R LE and instructed family on proper tech.   Transfers Overall transfer level: Needs assistance Equipment used: Rolling walker (2 wheeled) Transfers: Sit to/from Stand Sit to Stand: Min assist         General transfer comment: 255 VC's on proper hand placement and increased time to rise. Also assisted with toilet transfer  Ambulation/Gait Ambulation/Gait assistance: Min guard Gait Distance (Feet): 32 Feet Assistive device: Rolling walker (2 wheeled) Gait Pattern/deviations: Step-to pattern;Decreased stride length;Decreased weight shift to right;Antalgic;Trunk flexed Gait velocity: decreased   General Gait Details: assisted with amb to bathroom then a limted distance in hallway.  Pt moving slowly with mild c/o nausea.   Stairs             Wheelchair Mobility    Modified Rankin (Stroke Patients Only)       Balance                                             Cognition Arousal/Alertness: Awake/alert Behavior During Therapy: WFL for tasks assessed/performed Overall Cognitive Status: Within Functional Limits for tasks assessed                                 General Comments: alot nausea and sleepy today      Exercises      General Comments        Pertinent Vitals/Pain Pain Assessment: 0-10 Pain Location: R knee  Pain Descriptors / Indicators: Aching;Grimacing Pain Intervention(s): Monitored during session;Repositioned;Ice applied    Home Living                      Prior Function            PT Goals (current goals can now be found in the care plan section) Progress towards PT goals: Progressing toward goals    Frequency    7X/week      PT Plan Current plan remains appropriate    Co-evaluation              AM-PAC PT "6 Clicks" Daily Activity  Outcome Measure  Difficulty turning over in bed (including adjusting bedclothes, sheets and blankets)?: A Lot Difficulty moving from lying on back to sitting on the side of the  bed? : A Lot Difficulty sitting down on and standing up from a chair with arms (e.g., wheelchair, bedside commode, etc,.)?: A Lot Help needed moving to and from a bed to chair (including a wheelchair)?: A Lot Help needed walking in hospital room?: A Lot Help needed climbing 3-5 steps with a railing? : Total 6 Click Score: 11    End of Session Equipment Utilized During Treatment: Gait belt Activity Tolerance: Other (comment)(nausea) Patient left: in chair;with chair alarm set;with call bell/phone within reach;with family/visitor present   PT Visit Diagnosis: Other abnormalities of gait and mobility (R26.89);Difficulty in walking, not elsewhere classified (R26.2)     Time: 1005-1030 PT Time Calculation (min) (ACUTE ONLY): 25 min  Charges:  $Gait Training: 8-22 mins $Therapeutic Activity: 8-22 mins                      Felecia Shelling  PTA Acute  Rehabilitation Services Pager      [phone] Office      [phone]

## 2018-02-23 NOTE — Care Plan (Signed)
Ortho Bundle Case Management Note  Patient Details  Name: Denise Ortega MRN: [id number] Date of Birth: May 27, 1945                     DME Arranged:  Dan Humphreys rolling DME Agency:  [employer]  HH Arranged:  PT HH Agency:  [facility]  Additional Comments: Please contact me with any questions of if this plan should need to change.  Shauna Hugh,  RN,BSN,MHA,CCM  [facility]ist  [phone] 02/23/2018, 8:46 AM

## 2018-02-23 NOTE — Progress Notes (Signed)
Physical Therapy Treatment Patient Details Name: Denise Ortega MRN: [id number] DOB: Feb 23, 1946 Today's Date: 02/23/2018    History of Present Illness 72 YO female s/p R TKR on 02/22/18. PMH includes anemia, asthma, dyspnea, HTN, R ankle fracture surgery, cataracts surgery, and bilat meniscus repairs.     PT Comments    POD # 1 pm session Assisted OOB to amb a second time in hallway.  Tired and sleepy pt did amb a greater distance.  Returned to bed and performed TKR TE's followed by ICE. Pt did NOT meet PT goals to D/C to home today with issues of nausea and pain control.     Follow Up Recommendations  Follow surgeon's recommendation for DC plan and follow-up therapies;Supervision for mobility/OOB     Equipment Recommendations  Rolling walker with 5" wheels;Cane    Recommendations for Other Services       Precautions / Restrictions Precautions Precautions: Fall Restrictions Weight Bearing Restrictions: No Other Position/Activity Restrictions: WBAT     Mobility  Bed Mobility Overal bed mobility: Needs Assistance Bed Mobility: Supine to Sit;Sit to Supine     Supine to sit: Min assist Sit to supine: Min assist   General bed mobility comments: assisted OOB and back to bed per pt request to sleep  Transfers Overall transfer level: Needs assistance Equipment used: Rolling walker (2 wheeled) Transfers: Sit to/from Stand Sit to Stand: Min assist         General transfer comment: 255 VC's on proper hand placement and increased time to rise. Also assisted with toilet transfer  Ambulation/Gait Ambulation/Gait assistance: Min guard Gait Distance (Feet): 43 Feet Assistive device: Rolling walker (2 wheeled) Gait Pattern/deviations: Step-to pattern;Decreased stride length;Decreased weight shift to right;Antalgic;Trunk flexed Gait velocity: decreased   General Gait Details: assisted OOB to amb a further distance in hallway.  255 VC's on safety with turns.   Stairs             Wheelchair Mobility    Modified Rankin (Stroke Patients Only)       Balance                                            Cognition Arousal/Alertness: Awake/alert Behavior During Therapy: WFL for tasks assessed/performed Overall Cognitive Status: Within Functional Limits for tasks assessed                                 General Comments: alot nausea and sleepy today      Exercises   Total Knee Replacement TE's 10 reps B LE ankle pumps 10 reps towel squeezes 10 reps knee presses 10 reps heel slides  10 reps SAQ's 10 reps SLR's 10 reps ABD Followed by ICE    General Comments        Pertinent Vitals/Pain Pain Assessment: 0-10 Pain Location: R knee  Pain Descriptors / Indicators: Aching;Grimacing Pain Intervention(s): Monitored during session;Repositioned;Ice applied    Home Living                      Prior Function            PT Goals (current goals can now be found in the care plan section) Progress towards PT goals: Progressing toward goals    Frequency    7X/week  PT Plan Current plan remains appropriate    Co-evaluation              AM-PAC PT "6 Clicks" Daily Activity  Outcome Measure  Difficulty turning over in bed (including adjusting bedclothes, sheets and blankets)?: A Lot Difficulty moving from lying on back to sitting on the side of the bed? : A Lot Difficulty sitting down on and standing up from a chair with arms (e.g., wheelchair, bedside commode, etc,.)?: A Lot Help needed moving to and from a bed to chair (including a wheelchair)?: A Lot Help needed walking in hospital room?: A Lot Help needed climbing 3-5 steps with a railing? : Total 6 Click Score: 11    End of Session Equipment Utilized During Treatment: Gait belt Activity Tolerance: Other (comment)(nausea) Patient left: in bed;with call bell/phone within reach;with family/visitor present Nurse Communication:  Mobility status;Other (comment) PT Visit Diagnosis: Other abnormalities of gait and mobility (R26.89);Difficulty in walking, not elsewhere classified (R26.2)     Time: 1610-9604 PT Time Calculation (min) (ACUTE ONLY): 40 min  Charges:  $Gait Training: 8-22 mins $Therapeutic Exercise: 8-22 mins $Therapeutic Activity: 8-22 mins                     Felecia Shelling  PTA Acute  Rehabilitation Services Pager      [phone] Office      [phone]

## 2018-02-23 NOTE — Progress Notes (Signed)
PATIENT ID: Denise Ortega  MRN: [id number]  DOB/AGE:  08-23-1945 / 72 y.o.  1 Day Post-Op Procedure(s) (LRB): RIGHT TOTAL KNEE ARTHROPLASTY (Right)    PROGRESS NOTE Subjective: Patient is alert, oriented, x1 Nausea, x1 Vomiting, yes passing gas. Taking PO well. Denies SOB, Chest or Calf Pain. Using Incentive Spirometer, PAS in place. Ambulate walked in room, Patient reports pain as 4/10 .    Objective: Vital signs in last 24 hours: Vitals:   02/22/18 1857 02/22/18 2110 02/23/18 0119 02/23/18 0502  BP: 127/80 (Abnormal) 151/67 127/63 (Abnormal) 146/57  Pulse: 75 71 70 74  Resp:  15 16 17   Temp: 98.1 F (36.7 C) 97.8 F (36.6 C) 97.9 F (36.6 C) (Abnormal) 97.5 F (36.4 C)  TempSrc: Oral Oral Oral Oral  SpO2: 99% 100% 99% 100%  Weight:      Height:          Intake/Output from previous day: I/O last 3 completed shifts: In: 2559.4 [P.O.:300; I.V.:2259.4] Out: 400 [Urine:350; Blood:50]   Intake/Output this shift: No intake/output data recorded.   LABORATORY DATA: Recent Labs    02/23/18 0638  WBC 9.5  HGB 9.8*  HCT 29.8*  PLT 277  NA 138  K 3.4*  CL 103  CO2 26  BUN 18  CREATININE 0.83  GLUCOSE 143*  CALCIUM 8.5*    Examination: Neurologically intact ABD soft Neurovascular intact Sensation intact distally Intact pulses distally Dorsiflexion/Plantar flexion intact Incision: dressing C/D/I No cellulitis present Compartment soft}  Assessment:   1 Day Post-Op Procedure(s) (LRB): RIGHT TOTAL KNEE ARTHROPLASTY (Right) ADDITIONAL DIAGNOSIS: Expected Acute Blood Loss Anemia, pernicious anemia  Patient's anticipated LOS is less than 2 midnights, meeting these requirements: - Younger than 61 - Lives within 1 hour of care - Has a competent adult at home to recover with post-op recover - NO history of  - Chronic pain requiring opiods  - Diabetes  - Coronary Artery Disease  - Heart failure  - Heart attack  - Stroke  - DVT/VTE  - Cardiac arrhythmia  -  Respiratory Failure/COPD  - Renal failure  - Anemia  - Advanced Liver disease       Plan: PT/OT WBAT, AROM and PROM  DVT Prophylaxis:  SCDx72hrs, ASA 81 mg BID x 2 weeks DISCHARGE PLAN: Home, today if passes PT goals DISCHARGE NEEDS: HHPT, Walker and 3-in-1 comode seat     Nestor Lewandowsky 02/23/2018, 7:32 AM

## 2018-02-24 DIAGNOSIS — M1711 Unilateral primary osteoarthritis, right knee: Secondary | ICD-10-CM | POA: Diagnosis not present

## 2018-02-24 LAB — CBC
HCT: 27.6 % — ABNORMAL LOW (ref 36.0–46.0)
Hemoglobin: 9 g/dL — ABNORMAL LOW (ref 12.0–15.0)
MCH: 27.8 pg (ref 26.0–34.0)
MCHC: 32.6 g/dL (ref 30.0–36.0)
MCV: 85.2 fL (ref 78.0–100.0)
PLATELETS: 259 10*3/uL (ref 150–400)
RBC: 3.24 MIL/uL — AB (ref 3.87–5.11)
RDW: 14.5 % (ref 11.5–15.5)
WBC: 8.1 10*3/uL (ref 4.0–10.5)

## 2018-02-24 MED ORDER — HYDROMORPHONE HCL 2 MG PO TABS: 2.0000 mg | ORAL_TABLET | ORAL | 0 refills | Status: DC | PRN

## 2018-02-24 NOTE — Progress Notes (Signed)
CSW consult-SNF  DISCHARGE PLAN: Home, today if passes PT  DISCHARGE NEEDS: HHPT, Walker and 3-in-1 comode seat  CSW will sign off for now.    Vivi Barrack, [place], MSW Clinical Social Worker  [phone] 02/24/2018  8:41 AM

## 2018-02-24 NOTE — Discharge Summary (Signed)
Patient ID: Denise Ortega MRN: [id number] DOB/AGE: 02-Apr-1946 72 y.o.  Admit date: 02/22/2018 Discharge date: 02/24/2018  Admission Diagnoses:  Principal Problem:   Osteoarthritis of right knee Active Problems:   Arthritis of right knee   Discharge Diagnoses:  Same  Past Medical History:  Diagnosis Date  . Anemia   . Asthma   . Dyspnea   . Hypertension     Surgeries: Procedure(s): RIGHT TOTAL KNEE ARTHROPLASTY on 02/22/2018   Consultants:   Discharged Condition: Improved  Hospital Course: Marnesha Gagen is an 72 y.o. female who was admitted 02/22/2018 for operative treatment ofOsteoarthritis of right knee. Patient has severe unremitting pain that affects sleep, daily activities, and work/hobbies. After pre-op clearance the patient was taken to the operating room on 02/22/2018 and underwent  Procedure(s): RIGHT TOTAL KNEE ARTHROPLASTY.    Patient was given perioperative antibiotics:  Anti-infectives (From admission, onward)   Start     Dose/Rate Route Frequency Ordered Stop   02/22/18 1015  ceFAZolin (ANCEF) IVPB 2g/100 mL premix     2 g 200 mL/hr over 30 Minutes Intravenous On call to O.R. 02/22/18 1011 02/22/18 1243       Patient was given sequential compression devices, early ambulation, and chemoprophylaxis to prevent DVT.  Patient benefited maximally from hospital stay and there were no complications.    Recent vital signs:  Patient Vitals for the past 24 hrs:  BP Temp Temp src Pulse Resp SpO2  02/23/18 2300 (Abnormal) 148/62 97.8 F (36.6 C) Oral 70 18 96 %  02/23/18 1354 (Abnormal) 152/64 98.2 F (36.8 C) Oral 62 18 94 %  02/23/18 0949 138/64 98.1 F (36.7 C) Oral 68 14 97 %     Recent laboratory studies:  Recent Labs    02/23/18 0638 02/24/18 0509  WBC 9.5 8.1  HGB 9.8* 9.0*  HCT 29.8* 27.6*  PLT 277 259  NA 138  --   K 3.4*  --   CL 103  --   CO2 26  --   BUN 18  --   CREATININE 0.83  --   GLUCOSE 143*  --   CALCIUM 8.5*  --      Discharge  Medications:   Allergies as of 02/24/2018    Allergen Reactions Comment   Ativan [lorazepam] Other (See Comments) Mental disturbances, pt's mother-in-law Not patient.      Medication List    Stop taking these medications   acetaminophen 325 MG tablet Commonly known as:  TYLENOL   aspirin 81 MG chewable tablet Replaced by:  aspirin EC 81 MG tablet   ibuprofen 200 MG tablet Commonly known as:  ADVIL,MOTRIN     Take these medications   albuterol 108 (90 Base) MCG/ACT inhaler Commonly known as:  PROVENTIL HFA;VENTOLIN HFA Inhale 1-2 puffs into the lungs every 6 (six) hours as needed for wheezing or shortness of breath.   aspirin EC 81 MG tablet Take 1 tablet (81 mg total) by mouth 2 (two) times daily. Replaces:  aspirin 81 MG chewable tablet   CALCIUM 1200+D3 PO Take 1 tablet by mouth daily.   cyanocobalamin 1000 MCG/ML injection Commonly known as:  (VITAMIN B-12) Inject 1,000 mcg into the skin every 30 (thirty) days.   GERITOL TONIC PO Take 15 mLs by mouth once a week. Geritol Tonic with Ferrex   HYDROmorphone 2 MG tablet Commonly known as:  DILAUDID Take 1 tablet (2 mg total) by mouth every 4 (four) hours as needed for severe pain.   LUBRICANT  DROPS 1.4 % ophthalmic solution Generic drug:  polyvinyl alcohol Place 1-2 drops into both eyes as needed for dry eyes.   tiZANidine 2 MG tablet Commonly known as:  ZANAFLEX Take 1 tablet (2 mg total) by mouth every 6 (six) hours as needed.   triamterene-hydrochlorothiazide 37.5-25 MG tablet Commonly known as:  MAXZIDE-25 Take 1 tablet by mouth daily.        Durable Medical Equipment  (From admission, onward)         Start     Ordered   02/22/18 1609  DME Walker rolling  Once    Question:  Patient needs a walker to treat with the following condition  Answer:  Status post right knee replacement   02/22/18 1608   02/22/18 1609  DME 3 n 1  Once     02/22/18 1608           Discharge Care Instructions  (From  admission, onward)         Start     Ordered   02/24/18 0000  Change dressing    Comments:  Change dressing if window has > 40% drainage   02/24/18 0658          Diagnostic Studies: Dg Chest 2 View  Result Date: 02/14/2018 CLINICAL DATA:  Preoperative evaluation EXAM: CHEST - 2 VIEW COMPARISON:  None. FINDINGS: Normal cardiac and mediastinal contours. No consolidative pulmonary opacities. No pleural effusion or pneumothorax. Thoracic spine degenerative changes. IMPRESSION: No acute cardiopulmonary process. Electronically Signed   By: Annia Belt M.D.   On: 02/14/2018 17:42    Disposition: Discharge disposition: 01-Home or Self Care       Discharge Instructions    Call MD / Call 911   Complete by:  As directed    If you experience chest pain or shortness of breath, CALL 911 and be transported to the hospital emergency room.  If you develope a fever above 101 F, pus (white drainage) or increased drainage or redness at the wound, or calf pain, call your surgeon's office.   Change dressing   Complete by:  As directed    Change dressing if window has > 40% drainage   Constipation Prevention   Complete by:  As directed    Drink plenty of fluids.  Prune juice may be helpful.  You may use a stool softener, such as Colace (over the counter) 100 mg twice a day.  Use MiraLax (over the counter) for constipation as needed.   Diet - low sodium heart healthy   Complete by:  As directed    Increase activity slowly as tolerated   Complete by:  As directed       Follow-up Information    Gean Birchwood, MD. Go on 03/07/2018.   Specialty:  Orthopedic Surgery Why:  Your follow up appointment with Dr Turner Daniels is set for 03/07/18 at 1:45p.  Contact information: [address] [place] [address] [phone]        [facility]. Go on 03/08/2018.   Why:  You are scheduled to start Outpatient physical therapy on 03/08/18 at 10:00. Please arrive at 9:30 to complete  paperwork.  Contact information: [address]  [place], [place] [address] [phone]       [facility] Follow up.   Why:  HHPT will come out for 5 visits prior to your followup with Dr. Turner Daniels.  The office contact number is [phone]  Signed: Nestor Lewandowsky 02/24/2018, 6:59 AM

## 2018-02-24 NOTE — Progress Notes (Signed)
Physical Therapy Treatment Patient Details Name: Denise Ortega MRN: [id number] DOB: July 02, 1945 Today's Date: 02/24/2018    History of Present Illness 72 YO female s/p R TKR on 02/22/18. PMH includes anemia, asthma, dyspnea, HTN, R ankle fracture surgery, cataracts surgery, and bilat meniscus repairs.     PT Comments    POD # 2 am session Assisted OOB to amb to bathroom then in hallway with family present   Follow Up Recommendations  Follow surgeon's recommendation for DC plan and follow-up therapies;Supervision for mobility/OOB     Equipment Recommendations  Rolling walker with 5" wheels;Cane    Recommendations for Other Services       Precautions / Restrictions Precautions Precautions: Fall Restrictions Weight Bearing Restrictions: No Other Position/Activity Restrictions: WBAT     Mobility  Bed Mobility Overal bed mobility: Needs Assistance Bed Mobility: Supine to Sit     Supine to sit: Min assist     General bed mobility comments: increased time  Transfers Overall transfer level: Needs assistance Equipment used: Rolling walker (2 wheeled) Transfers: Sit to/from Stand Sit to Stand: Min assist         General transfer comment: increased time  Ambulation/Gait Ambulation/Gait assistance: Supervision;Min guard Gait Distance (Feet): 85 Feet Assistive device: Rolling walker (2 wheeled) Gait Pattern/deviations: Step-to pattern;Decreased stride length;Decreased weight shift to right;Antalgic;Trunk flexed Gait velocity: decreased   General Gait Details: assisted OOB to amb a further distance in hallway.  255 VC's on safety with turns.   Stairs             Wheelchair Mobility    Modified Rankin (Stroke Patients Only)       Balance                                            Cognition Arousal/Alertness: Awake/alert Behavior During Therapy: WFL for tasks assessed/performed Overall Cognitive Status: Within Functional Limits for  tasks assessed                                        Exercises      General Comments        Pertinent Vitals/Pain Pain Assessment: 0-10 Pain Score: 4  Pain Location: R knee  Pain Descriptors / Indicators: Aching;Grimacing Pain Intervention(s): Monitored during session;Repositioned;Ice applied    Home Living                      Prior Function            PT Goals (current goals can now be found in the care plan section) Progress towards PT goals: Progressing toward goals    Frequency    7X/week      PT Plan Current plan remains appropriate    Co-evaluation              AM-PAC PT "6 Clicks" Daily Activity  Outcome Measure  Difficulty turning over in bed (including adjusting bedclothes, sheets and blankets)?: A Little Difficulty moving from lying on back to sitting on the side of the bed? : A Little Difficulty sitting down on and standing up from a chair with arms (e.g., wheelchair, bedside commode, etc,.)?: A Little Help needed moving to and from a bed to chair (including a wheelchair)?: A Little Help needed walking in  hospital room?: A Little Help needed climbing 3-5 steps with a railing? : A Little 6 Click Score: 18    End of Session Equipment Utilized During Treatment: Gait belt Activity Tolerance: Patient tolerated treatment well Patient left: in chair;with family/visitor present;with call bell/phone within reach   PT Visit Diagnosis: Unsteadiness on feet (R26.81)     Time: 1610-9604 PT Time Calculation (min) (ACUTE ONLY): 15 min  Charges:  $Gait Training: 8-22 mins                     Felecia Shelling  PTA Acute  Rehabilitation Services Pager      [phone] Office      [phone]

## 2018-02-24 NOTE — Progress Notes (Signed)
Physical Therapy Treatment Patient Details Name: Denise Ortega MRN: [id number] DOB: [birth year] Today's Date: 02/24/2018    History of Present Illness 72 YO female s/p R TKR on 02/22/18. PMH includes anemia, asthma, dyspnea, HTN, R ankle fracture surgery, cataracts surgery, and bilat meniscus repairs.     PT Comments    POD # 2 pm session Practiced stairs, amb a greater distance, assisted into shower.  NT notified. Pt ready for D/C to home today.   Follow Up Recommendations  Follow surgeon's recommendation for DC plan and follow-up therapies;Supervision for mobility/OOB     Equipment Recommendations  Rolling walker with 5" wheels;Cane    Recommendations for Other Services       Precautions / Restrictions Precautions Precautions: Fall Restrictions Weight Bearing Restrictions: No Other Position/Activity Restrictions: WBAT     Mobility  Bed Mobility Overal bed mobility: Needs Assistance Bed Mobility: Supine to Sit     Supine to sit: Min assist     General bed mobility comments: increased time  Transfers Overall transfer level: Needs assistance Equipment used: Rolling walker (2 wheeled) Transfers: Sit to/from Stand Sit to Stand: Min assist         General transfer comment: increased time  Ambulation/Gait Ambulation/Gait assistance: Supervision;Min guard Gait Distance (Feet): 110 Feet Assistive device: Rolling walker (2 wheeled) Gait Pattern/deviations: Step-to pattern;Decreased stride length;Decreased weight shift to right;Antalgic;Trunk flexed Gait velocity: decreased   General Gait Details: assisted OOB to amb a further distance in hallway.  255 VC's on safety with turns.   Stairs Stairs: Yes Stairs assistance: Min guard;Min assist Stair Management: One rail Right;Step to pattern;Forwards;With cane Number of Stairs: 3 General stair comments: with spouse "hands on" assist with 25% VC's on proper tech and safety   Wheelchair Mobility    Modified  Rankin (Stroke Patients Only)       Balance                                            Cognition Arousal/Alertness: Awake/alert Behavior During Therapy: WFL for tasks assessed/performed Overall Cognitive Status: Within Functional Limits for tasks assessed                                        Exercises      General Comments        Pertinent Vitals/Pain Pain Assessment: 0-10 Pain Score: 4  Pain Location: R knee  Pain Descriptors / Indicators: Aching;Grimacing Pain Intervention(s): Monitored during session;Repositioned;Ice applied    Home Living                      Prior Function            PT Goals (current goals can now be found in the care plan section) Progress towards PT goals: Progressing toward goals    Frequency    7X/week      PT Plan Current plan remains appropriate    Co-evaluation              AM-PAC PT "6 Clicks" Daily Activity  Outcome Measure  Difficulty turning over in bed (including adjusting bedclothes, sheets and blankets)?: A Little Difficulty moving from lying on back to sitting on the side of the bed? : A Little Difficulty sitting down on and standing  up from a chair with arms (e.g., wheelchair, bedside commode, etc,.)?: A Little Help needed moving to and from a bed to chair (including a wheelchair)?: A Little Help needed walking in hospital room?: A Little Help needed climbing 3-5 steps with a railing? : A Little 6 Click Score: 18    End of Session Equipment Utilized During Treatment: Gait belt Activity Tolerance: Patient tolerated treatment well Patient left: in chair;with family/visitor present;with call bell/phone within reach   PT Visit Diagnosis: Unsteadiness on feet (R26.81)     Time: 1000-1025 PT Time Calculation (min) (ACUTE ONLY): 25 min  Charges:  $Gait Training: 8-22 mins $Therapeutic Activity: 8-22 mins                     Felecia Shelling  PTA Acute   Rehabilitation Services Pager      [phone] Office      [phone]

## 2018-02-24 NOTE — Progress Notes (Signed)
PATIENT ID: Denise Ortega  MRN: [id number]  DOB/AGE:  71-Mar-1947 / 71 y.o.  2 Days Post-Op Procedure(s) (LRB): RIGHT TOTAL KNEE ARTHROPLASTY (Right)    PROGRESS NOTE Subjective: Patient is alert, oriented, reports less nausea on 2 mg Dilaudid tablets for pain control.  No vomiting last night.  Walked 43 feet with physical therapy think she will be able to walk 150 feet in past PT today so that she can go home.  Objective: Vital signs in last 24 hours: Vitals:   02/23/18 0502 02/23/18 0949 02/23/18 1354 02/23/18 2300  BP: (Abnormal) 146/57 138/64 (Abnormal) 152/64 (Abnormal) 148/62  Pulse: 74 68 62 70  Resp: 17 14 18 18   Temp: (Abnormal) 97.5 F (36.4 C) 98.1 F (36.7 C) 98.2 F (36.8 C) 97.8 F (36.6 C)  TempSrc: Oral Oral Oral Oral  SpO2: 100% 97% 94% 96%  Weight:      Height:          Intake/Output from previous day: I/O last 3 completed shifts: In: 2921.8 [P.O.:1280; I.V.:1641.8] Out: 400 [Urine:400]   Intake/Output this shift: No intake/output data recorded.   LABORATORY DATA: Recent Labs    02/23/18 0638 02/24/18 0509  WBC 9.5 8.1  HGB 9.8* 9.0*  HCT 29.8* 27.6*  PLT 277 259  NA 138  --   K 3.4*  --   CL 103  --   CO2 26  --   BUN 18  --   CREATININE 0.83  --   GLUCOSE 143*  --   CALCIUM 8.5*  --     Examination: Neurologically intact ABD soft Neurovascular intact Sensation intact distally Intact pulses distally Dorsiflexion/Plantar flexion intact Incision: dressing C/D/I No cellulitis present Compartment soft}  Assessment:   2 Days Post-Op Procedure(s) (LRB): RIGHT TOTAL KNEE ARTHROPLASTY (Right) ADDITIONAL DIAGNOSIS: Expected Acute Blood Loss Anemia,  Anticipated LOS equal to or greater than 2 midnights due to - Unanticipated findings during/Post Surgery: Slow post-op progression: GI, pain control, mobility     Plan: PT/OT WBAT, AROM and PROM  DVT Prophylaxis:  SCDx72hrs, ASA 81 mg BID x 2 weeks DISCHARGE PLAN: Home, today if passes  PT DISCHARGE NEEDS: HHPT, Walker and 3-in-1 comode seat     Nestor Lewandowsky 02/24/2018, 7:06 AM

## 2018-10-15 MED ORDER — FENTANYL CITRATE (PF) 50 MCG/ML IJ SOLN
INTRAMUSCULAR | Status: DC
Start: ? — End: 2018-10-15

## 2018-10-18 ENCOUNTER — Other Ambulatory Visit: Payer: Self-pay

## 2018-10-18 ENCOUNTER — Other Ambulatory Visit: Payer: Self-pay | Admitting: Orthopedic Surgery

## 2018-10-18 ENCOUNTER — Encounter (HOSPITAL_COMMUNITY): Payer: Self-pay | Admitting: *Deleted

## 2018-10-19 ENCOUNTER — Inpatient Hospital Stay (HOSPITAL_COMMUNITY): Payer: Medicare Other | Admitting: Physician Assistant

## 2018-10-19 ENCOUNTER — Other Ambulatory Visit (HOSPITAL_COMMUNITY): Payer: Self-pay

## 2018-10-19 ENCOUNTER — Encounter (HOSPITAL_COMMUNITY): Payer: Self-pay | Admitting: *Deleted

## 2018-10-19 ENCOUNTER — Encounter (HOSPITAL_COMMUNITY)
Admission: RE | Disposition: A | Discharge status: Home / Self Care | Payer: Self-pay | Source: Home / Self Care | Attending: Orthopedic Surgery

## 2018-10-19 ENCOUNTER — Inpatient Hospital Stay (HOSPITAL_COMMUNITY)
Admission: RE | Admit: 2018-10-19 | Discharge: 2018-10-21 | DRG: 483 | Disposition: A | Discharge status: Home / Self Care | Payer: Medicare Other | Attending: Orthopedic Surgery | Admitting: Orthopedic Surgery

## 2018-10-19 ENCOUNTER — Inpatient Hospital Stay (HOSPITAL_COMMUNITY): Payer: Medicare Other

## 2018-10-19 ENCOUNTER — Other Ambulatory Visit (HOSPITAL_COMMUNITY)
Admission: RE | Admit: 2018-10-19 | Discharge: 2018-10-19 | Disposition: A | Discharge status: Home / Self Care | Payer: Medicare Other | Source: Ambulatory Visit | Attending: Orthopedic Surgery | Admitting: Orthopedic Surgery

## 2018-10-19 ENCOUNTER — Telehealth (HOSPITAL_COMMUNITY): Payer: Self-pay | Admitting: *Deleted

## 2018-10-19 DIAGNOSIS — M1711 Unilateral primary osteoarthritis, right knee: Secondary | ICD-10-CM | POA: Diagnosis present

## 2018-10-19 DIAGNOSIS — I160 Hypertensive urgency: Secondary | ICD-10-CM | POA: Diagnosis present

## 2018-10-19 DIAGNOSIS — I10 Essential (primary) hypertension: Secondary | ICD-10-CM | POA: Diagnosis present

## 2018-10-19 DIAGNOSIS — E876 Hypokalemia: Secondary | ICD-10-CM | POA: Diagnosis not present

## 2018-10-19 DIAGNOSIS — Z96651 Presence of right artificial knee joint: Secondary | ICD-10-CM | POA: Diagnosis present

## 2018-10-19 DIAGNOSIS — W19XXXA Unspecified fall, initial encounter: Secondary | ICD-10-CM | POA: Diagnosis present

## 2018-10-19 DIAGNOSIS — D62 Acute posthemorrhagic anemia: Secondary | ICD-10-CM | POA: Diagnosis not present

## 2018-10-19 DIAGNOSIS — Z87891 Personal history of nicotine dependence: Secondary | ICD-10-CM | POA: Diagnosis not present

## 2018-10-19 DIAGNOSIS — S42292A Other displaced fracture of upper end of left humerus, initial encounter for closed fracture: Principal | ICD-10-CM | POA: Diagnosis present

## 2018-10-19 DIAGNOSIS — Z90710 Acquired absence of both cervix and uterus: Secondary | ICD-10-CM | POA: Diagnosis not present

## 2018-10-19 DIAGNOSIS — Z96619 Presence of unspecified artificial shoulder joint: Secondary | ICD-10-CM

## 2018-10-19 DIAGNOSIS — J45909 Unspecified asthma, uncomplicated: Secondary | ICD-10-CM | POA: Diagnosis present

## 2018-10-19 DIAGNOSIS — Z1159 Encounter for screening for other viral diseases: Secondary | ICD-10-CM | POA: Diagnosis not present

## 2018-10-19 DIAGNOSIS — Z96612 Presence of left artificial shoulder joint: Secondary | ICD-10-CM | POA: Diagnosis not present

## 2018-10-19 DIAGNOSIS — S42202A Unspecified fracture of upper end of left humerus, initial encounter for closed fracture: Secondary | ICD-10-CM | POA: Diagnosis present

## 2018-10-19 HISTORY — PX: REVERSE SHOULDER ARTHROPLASTY: SHX5054

## 2018-10-19 HISTORY — DX: Unspecified fracture of right foot, initial encounter for closed fracture: S92.901A

## 2018-10-19 HISTORY — DX: Unspecified fracture of shaft of humerus, unspecified arm, initial encounter for closed fracture: S42.309A

## 2018-10-19 LAB — BASIC METABOLIC PANEL
Anion gap: 10 (ref 5–15)
BUN: 25 mg/dL — ABNORMAL HIGH (ref 8–23)
CO2: 23 mmol/L (ref 22–32)
Calcium: 8.8 mg/dL — ABNORMAL LOW (ref 8.9–10.3)
Chloride: 103 mmol/L (ref 98–111)
Creatinine, Ser: 0.81 mg/dL (ref 0.44–1.00)
GFR calc Af Amer: >60 mL/min (ref >60)
GFR calc non Af Amer: >60 mL/min (ref >60)
Glucose, Bld: 113 mg/dL — ABNORMAL HIGH (ref 70–99)
Potassium: 5.6 mmol/L — ABNORMAL HIGH (ref 3.5–5.1)
Sodium: 136 mmol/L (ref 135–145)

## 2018-10-19 LAB — CBC
HCT: 30.2 % — ABNORMAL LOW (ref 36.0–46.0)
Hemoglobin: 8.8 g/dL — ABNORMAL LOW (ref 12.0–15.0)
MCH: 23.3 pg — ABNORMAL LOW (ref 26.0–34.0)
MCHC: 29.1 g/dL — ABNORMAL LOW (ref 30.0–36.0)
MCV: 80.1 fL (ref 80.0–100.0)
Platelets: 401 10*3/uL — ABNORMAL HIGH (ref 150–400)
RBC: 3.77 MIL/uL — ABNORMAL LOW (ref 3.87–5.11)
RDW: 15.5 % (ref 11.5–15.5)
WBC: 10.9 10*3/uL — ABNORMAL HIGH (ref 4.0–10.5)
nRBC: 0 % (ref 0.0–0.2)

## 2018-10-19 LAB — SARS CORONAVIRUS 2 BY RT PCR (HOSPITAL ORDER, PERFORMED IN ~~LOC~~ HOSPITAL LAB): SARS Coronavirus 2: NEGATIVE

## 2018-10-19 SURGERY — ARTHROPLASTY, SHOULDER, TOTAL, REVERSE
Anesthesia: Regional | Site: Shoulder | Laterality: Left

## 2018-10-19 MED ORDER — METHOCARBAMOL 500 MG PO TABS
500.0000 mg | ORAL_TABLET | Freq: Four times a day (QID) | ORAL | Status: DC | PRN
  Administered 2018-10-20 – 2018-10-21 (×2): 500 mg via ORAL
  Filled 2018-10-19 (×2): qty 1

## 2018-10-19 MED ORDER — POVIDONE-IODINE 10 % EX SWAB: 2.0000 "application " | Freq: Once | CUTANEOUS | Status: DC

## 2018-10-19 MED ORDER — SODIUM CHLORIDE 0.9 % IV SOLN
125.0000 mL/h | INTRAVENOUS | Status: AC
  Administered 2018-10-19: 125 mL/h via INTRAVENOUS

## 2018-10-19 MED ORDER — LACTATED RINGERS IV SOLN
INTRAVENOUS | Status: DC
  Administered 2018-10-19: 10:00:00 via INTRAVENOUS

## 2018-10-19 MED ORDER — ZOLPIDEM TARTRATE 5 MG PO TABS: 5.0000 mg | ORAL_TABLET | Freq: Every evening | ORAL | Status: DC | PRN

## 2018-10-19 MED ORDER — LIDOCAINE 2% (20 MG/ML) 5 ML SYRINGE
INTRAMUSCULAR | Status: DC | PRN
  Administered 2018-10-19: 50 mg via INTRAVENOUS

## 2018-10-19 MED ORDER — CHLORHEXIDINE GLUCONATE 4 % EX LIQD: 60.0000 mL | Freq: Once | CUTANEOUS | Status: DC

## 2018-10-19 MED ORDER — ONDANSETRON HCL 4 MG PO TABS: 4.0000 mg | ORAL_TABLET | Freq: Four times a day (QID) | ORAL | Status: DC | PRN

## 2018-10-19 MED ORDER — PROPOFOL 10 MG/ML IV BOLUS
INTRAVENOUS | Status: AC
  Filled 2018-10-19: qty 20

## 2018-10-19 MED ORDER — ACETAMINOPHEN 500 MG PO TABS
1000.0000 mg | ORAL_TABLET | Freq: Once | ORAL | Status: AC
  Administered 2018-10-19: 11:00:00 1000 mg via ORAL
  Filled 2018-10-19: qty 2

## 2018-10-19 MED ORDER — BUPIVACAINE HCL (PF) 0.5 % IJ SOLN
INTRAMUSCULAR | Status: DC | PRN
  Administered 2018-10-19: 15 mL via PERINEURAL

## 2018-10-19 MED ORDER — MORPHINE SULFATE (PF) 2 MG/ML IV SOLN: 0.5000 mg | INTRAVENOUS | Status: DC | PRN

## 2018-10-19 MED ORDER — ALBUTEROL SULFATE (2.5 MG/3ML) 0.083% IN NEBU: 2.5000 mg | INHALATION_SOLUTION | Freq: Four times a day (QID) | RESPIRATORY_TRACT | Status: DC | PRN

## 2018-10-19 MED ORDER — SODIUM CHLORIDE 0.9 % IV SOLN
INTRAVENOUS | Status: DC | PRN
  Administered 2018-10-19: 13:00:00 10 ug/min via INTRAVENOUS

## 2018-10-19 MED ORDER — FENTANYL CITRATE (PF) 250 MCG/5ML IJ SOLN
INTRAMUSCULAR | Status: DC | PRN
  Administered 2018-10-19: 50 ug via INTRAVENOUS
  Administered 2018-10-19: 25 ug via INTRAVENOUS

## 2018-10-19 MED ORDER — HYDROCODONE-ACETAMINOPHEN 7.5-325 MG PO TABS
1.0000 | ORAL_TABLET | ORAL | Status: DC | PRN
  Administered 2018-10-20: 1 via ORAL
  Administered 2018-10-20 – 2018-10-21 (×2): 2 via ORAL
  Filled 2018-10-19 (×2): qty 2
  Filled 2018-10-19: qty 1
  Filled 2018-10-19: qty 2

## 2018-10-19 MED ORDER — FLEET ENEMA 7-19 GM/118ML RE ENEM: 1.0000 | ENEMA | Freq: Once | RECTAL | Status: DC | PRN

## 2018-10-19 MED ORDER — ROCURONIUM BROMIDE 10 MG/ML (PF) SYRINGE
PREFILLED_SYRINGE | INTRAVENOUS | Status: AC
  Filled 2018-10-19: qty 10

## 2018-10-19 MED ORDER — ONDANSETRON HCL 4 MG/2ML IJ SOLN
INTRAMUSCULAR | Status: AC
  Filled 2018-10-19: qty 2

## 2018-10-19 MED ORDER — EPHEDRINE 5 MG/ML INJ
INTRAVENOUS | Status: AC
  Filled 2018-10-19: qty 10

## 2018-10-19 MED ORDER — PROPOFOL 10 MG/ML IV BOLUS
INTRAVENOUS | Status: DC | PRN
  Administered 2018-10-19: 130 mg via INTRAVENOUS

## 2018-10-19 MED ORDER — CEFAZOLIN SODIUM-DEXTROSE 2-4 GM/100ML-% IV SOLN
2.0000 g | INTRAVENOUS | Status: AC
  Administered 2018-10-19: 13:00:00 2 g via INTRAVENOUS
  Filled 2018-10-19: qty 100

## 2018-10-19 MED ORDER — FENTANYL CITRATE (PF) 100 MCG/2ML IJ SOLN: 50.0000 ug | INTRAMUSCULAR | Status: DC

## 2018-10-19 MED ORDER — TRANEXAMIC ACID-NACL 1000-0.7 MG/100ML-% IV SOLN
1000.0000 mg | INTRAVENOUS | Status: AC
  Administered 2018-10-19: 13:00:00 1000 mg via INTRAVENOUS
  Filled 2018-10-19: qty 100

## 2018-10-19 MED ORDER — ALBUTEROL SULFATE HFA 108 (90 BASE) MCG/ACT IN AERS: 2.0000 | INHALATION_SPRAY | Freq: Four times a day (QID) | RESPIRATORY_TRACT | Status: DC | PRN

## 2018-10-19 MED ORDER — HYDROCODONE-ACETAMINOPHEN 5-325 MG PO TABS
1.0000 | ORAL_TABLET | ORAL | Status: DC | PRN
  Administered 2018-10-20 – 2018-10-21 (×2): 2 via ORAL
  Filled 2018-10-19 (×2): qty 2

## 2018-10-19 MED ORDER — SUGAMMADEX SODIUM 200 MG/2ML IV SOLN
INTRAVENOUS | Status: DC | PRN
  Administered 2018-10-19: 200 mg via INTRAVENOUS

## 2018-10-19 MED ORDER — BUPIVACAINE-EPINEPHRINE (PF) 0.25% -1:200000 IJ SOLN
INTRAMUSCULAR | Status: AC
  Filled 2018-10-19: qty 30

## 2018-10-19 MED ORDER — METHOCARBAMOL 500 MG IVPB - SIMPLE MED
500.0000 mg | Freq: Four times a day (QID) | INTRAVENOUS | Status: DC | PRN
  Filled 2018-10-19: qty 50

## 2018-10-19 MED ORDER — ASPIRIN EC 325 MG PO TBEC
325.0000 mg | DELAYED_RELEASE_TABLET | Freq: Two times a day (BID) | ORAL | Status: DC
  Administered 2018-10-19 – 2018-10-21 (×4): 325 mg via ORAL
  Filled 2018-10-19 (×4): qty 1

## 2018-10-19 MED ORDER — CEFAZOLIN SODIUM-DEXTROSE 2-4 GM/100ML-% IV SOLN
2.0000 g | Freq: Four times a day (QID) | INTRAVENOUS | Status: AC
  Administered 2018-10-19 – 2018-10-20 (×3): 2 g via INTRAVENOUS
  Filled 2018-10-19 (×3): qty 100

## 2018-10-19 MED ORDER — PHENYLEPHRINE 40 MCG/ML (10ML) SYRINGE FOR IV PUSH (FOR BLOOD PRESSURE SUPPORT)
PREFILLED_SYRINGE | INTRAVENOUS | Status: DC | PRN
  Administered 2018-10-19: 40 ug via INTRAVENOUS

## 2018-10-19 MED ORDER — PHENYLEPHRINE HCL (PRESSORS) 10 MG/ML IV SOLN
INTRAVENOUS | Status: AC
  Filled 2018-10-19: qty 1

## 2018-10-19 MED ORDER — ROCURONIUM BROMIDE 10 MG/ML (PF) SYRINGE
PREFILLED_SYRINGE | INTRAVENOUS | Status: DC | PRN
  Administered 2018-10-19: 60 mg via INTRAVENOUS

## 2018-10-19 MED ORDER — MIDAZOLAM HCL 2 MG/2ML IJ SOLN
1.0000 mg | INTRAMUSCULAR | Status: AC
  Administered 2018-10-19: 11:00:00 2 mg via INTRAVENOUS

## 2018-10-19 MED ORDER — BUPIVACAINE LIPOSOME 1.3 % IJ SUSP
INTRAMUSCULAR | Status: DC | PRN
  Administered 2018-10-19: 10 mL via PERINEURAL

## 2018-10-19 MED ORDER — ONDANSETRON HCL 4 MG/2ML IJ SOLN: 4.0000 mg | Freq: Four times a day (QID) | INTRAMUSCULAR | Status: DC | PRN

## 2018-10-19 MED ORDER — PHENYLEPHRINE 40 MCG/ML (10ML) SYRINGE FOR IV PUSH (FOR BLOOD PRESSURE SUPPORT)
PREFILLED_SYRINGE | INTRAVENOUS | Status: AC
  Filled 2018-10-19: qty 10

## 2018-10-19 MED ORDER — DEXAMETHASONE SODIUM PHOSPHATE 10 MG/ML IJ SOLN
INTRAMUSCULAR | Status: AC
  Filled 2018-10-19: qty 1

## 2018-10-19 MED ORDER — DEXAMETHASONE SODIUM PHOSPHATE 10 MG/ML IJ SOLN
INTRAMUSCULAR | Status: DC | PRN
  Administered 2018-10-19: 4 mg via INTRAVENOUS

## 2018-10-19 MED ORDER — BUPIVACAINE-EPINEPHRINE (PF) 0.5% -1:200000 IJ SOLN
INTRAMUSCULAR | Status: AC
  Filled 2018-10-19: qty 30

## 2018-10-19 MED ORDER — POLYETHYLENE GLYCOL 3350 17 G PO PACK: 17.0000 g | PACK | Freq: Every day | ORAL | Status: DC | PRN

## 2018-10-19 MED ORDER — DIPHENHYDRAMINE HCL 12.5 MG/5ML PO ELIX: 12.5000 mg | ORAL_SOLUTION | ORAL | Status: DC | PRN

## 2018-10-19 MED ORDER — ACETAMINOPHEN 325 MG PO TABS
325.0000 mg | ORAL_TABLET | Freq: Four times a day (QID) | ORAL | Status: DC | PRN
  Administered 2018-10-20 – 2018-10-21 (×2): 650 mg via ORAL
  Filled 2018-10-19 (×2): qty 2

## 2018-10-19 MED ORDER — ONDANSETRON HCL 4 MG/2ML IJ SOLN
INTRAMUSCULAR | Status: DC | PRN
  Administered 2018-10-19: 4 mg via INTRAVENOUS

## 2018-10-19 MED ORDER — FENTANYL CITRATE (PF) 100 MCG/2ML IJ SOLN: 25.0000 ug | INTRAMUSCULAR | Status: DC | PRN

## 2018-10-19 MED ORDER — MIDAZOLAM HCL 2 MG/2ML IJ SOLN
INTRAMUSCULAR | Status: AC
  Administered 2018-10-19: 11:00:00 2 mg via INTRAVENOUS
  Filled 2018-10-19: qty 2

## 2018-10-19 MED ORDER — MENTHOL 3 MG MT LOZG: 1.0000 | LOZENGE | OROMUCOSAL | Status: DC | PRN

## 2018-10-19 MED ORDER — TRIAMTERENE-HCTZ 37.5-25 MG PO TABS
1.0000 | ORAL_TABLET | Freq: Every day | ORAL | Status: DC
  Administered 2018-10-20 – 2018-10-21 (×2): 1 via ORAL
  Filled 2018-10-19 (×2): qty 1

## 2018-10-19 MED ORDER — PHENOL 1.4 % MT LIQD
1.0000 | OROMUCOSAL | Status: DC | PRN
  Filled 2018-10-19: qty 177

## 2018-10-19 MED ORDER — SUCCINYLCHOLINE CHLORIDE 200 MG/10ML IV SOSY
PREFILLED_SYRINGE | INTRAVENOUS | Status: AC
  Filled 2018-10-19: qty 10

## 2018-10-19 MED ORDER — FENTANYL CITRATE (PF) 100 MCG/2ML IJ SOLN
INTRAMUSCULAR | Status: AC
  Filled 2018-10-19: qty 2

## 2018-10-19 MED ORDER — BISACODYL 5 MG PO TBEC: 5.0000 mg | DELAYED_RELEASE_TABLET | Freq: Every day | ORAL | Status: DC | PRN

## 2018-10-19 MED ORDER — DOCUSATE SODIUM 100 MG PO CAPS
100.0000 mg | ORAL_CAPSULE | Freq: Two times a day (BID) | ORAL | Status: DC
  Administered 2018-10-19 – 2018-10-21 (×4): 100 mg via ORAL
  Filled 2018-10-19 (×4): qty 1

## 2018-10-19 MED ORDER — 0.9 % SODIUM CHLORIDE (POUR BTL) OPTIME
TOPICAL | Status: DC | PRN
  Administered 2018-10-19: 1000 mL

## 2018-10-19 MED ORDER — ALUM & MAG HYDROXIDE-SIMETH 200-200-20 MG/5ML PO SUSP: 30.0000 mL | ORAL | Status: DC | PRN

## 2018-10-19 MED ORDER — LIDOCAINE 2% (20 MG/ML) 5 ML SYRINGE
INTRAMUSCULAR | Status: AC
  Filled 2018-10-19: qty 5

## 2018-10-19 MED ORDER — METOCLOPRAMIDE HCL 5 MG/ML IJ SOLN: 5.0000 mg | Freq: Three times a day (TID) | INTRAMUSCULAR | Status: DC | PRN

## 2018-10-19 MED ORDER — METOCLOPRAMIDE HCL 5 MG PO TABS: 5.0000 mg | ORAL_TABLET | Freq: Three times a day (TID) | ORAL | Status: DC | PRN

## 2018-10-19 SURGICAL SUPPLY — 74 items
BAG ZIPLOCK 12X15 (MISCELLANEOUS) ×3 IMPLANT
BASEPLATE GLENOSPHERE 25 (Plate) ×2 IMPLANT
BASEPLATE GLENOSPHERE 25MM (Plate) ×1 IMPLANT
BEARING HUMERAL 40 STD VITE (Joint) ×3 IMPLANT
BIT DRILL TWIST 2.7 (BIT) ×2 IMPLANT
BIT DRILL TWIST 2.7MM (BIT) ×1
BLADE EXTENDED COATED 6.5IN (ELECTRODE) ×3 IMPLANT
BLADE SAW SAG 73X25 THK (BLADE) ×2
BLADE SAW SGTL 73X25 THK (BLADE) ×1 IMPLANT
BOOTIES KNEE HIGH SLOAN (MISCELLANEOUS) ×6 IMPLANT
CLEANER TIP ELECTROSURG 2X2 (MISCELLANEOUS) ×3 IMPLANT
CLOSURE WOUND 1/2 X4 (GAUZE/BANDAGES/DRESSINGS) ×1
COVER BACK TABLE 60X90IN (DRAPES) ×3 IMPLANT
COVER SURGICAL LIGHT HANDLE (MISCELLANEOUS) ×3 IMPLANT
COVER WAND RF STERILE (DRAPES) IMPLANT
DIAL VERSA SHOULDER 40 STD (Joint) ×3 IMPLANT
DRAPE INCISE IOBAN 66X45 STRL (DRAPES) ×3 IMPLANT
DRAPE ORTHO SPLIT 77X108 STRL (DRAPES)
DRAPE POUCH INSTRU U-SHP 10X18 (DRAPES) ×3 IMPLANT
DRAPE SURG 17X11 SM STRL (DRAPES) ×3 IMPLANT
DRAPE SURG ORHT 6 SPLT 77X108 (DRAPES) IMPLANT
DRAPE U-SHAPE 47X51 STRL (DRAPES) ×3 IMPLANT
DRSG AQUACEL AG ADV 3.5X 6 (GAUZE/BANDAGES/DRESSINGS) ×3 IMPLANT
DRSG AQUACEL AG ADV 3.5X10 (GAUZE/BANDAGES/DRESSINGS) ×3 IMPLANT
DURAPREP 26ML APPLICATOR (WOUND CARE) ×3 IMPLANT
ELECT REM PT RETURN 15FT ADLT (MISCELLANEOUS) ×3 IMPLANT
EVACUATOR 1/8 PVC DRAIN (DRAIN) IMPLANT
FACESHIELD WRAPAROUND (MASK) ×3 IMPLANT
GLOVE BIO SURGEON STRL SZ7 (GLOVE) ×3 IMPLANT
GLOVE BIO SURGEON STRL SZ7.5 (GLOVE) ×3 IMPLANT
GLOVE BIOGEL PI IND STRL 7.5 (GLOVE) ×1 IMPLANT
GLOVE BIOGEL PI IND STRL 8 (GLOVE) ×1 IMPLANT
GLOVE BIOGEL PI INDICATOR 7.5 (GLOVE) ×2
GLOVE BIOGEL PI INDICATOR 8 (GLOVE) ×2
GOWN STRL REUS W/TWL LRG LVL3 (GOWN DISPOSABLE) ×3 IMPLANT
GOWN STRL REUS W/TWL XL LVL3 (GOWN DISPOSABLE) ×3 IMPLANT
HANDPIECE INTERPULSE COAX TIP (DISPOSABLE) ×2
HOOD PEEL AWAY FLYTE STAYCOOL (MISCELLANEOUS) ×6 IMPLANT
KIT BASIN OR (CUSTOM PROCEDURE TRAY) ×3 IMPLANT
KIT TURNOVER KIT A (KITS) IMPLANT
MANIFOLD NEPTUNE II (INSTRUMENTS) ×3 IMPLANT
NEEDLE MA TROC 1/2 (NEEDLE) ×3 IMPLANT
NS IRRIG 1000ML POUR BTL (IV SOLUTION) ×3 IMPLANT
PACK SHOULDER (CUSTOM PROCEDURE TRAY) ×3 IMPLANT
PIN THREADED REVERSE (PIN) ×3 IMPLANT
PROTECTOR NERVE ULNAR (MISCELLANEOUS) ×3 IMPLANT
RETRIEVER SUT HEWSON (MISCELLANEOUS) IMPLANT
SCREW BONE LOCKING 4.75X30X3.5 (Screw) ×3 IMPLANT
SCREW BONE STRL 6.5MMX30MM (Screw) ×3 IMPLANT
SCREW LOCKING 4.75MMX15MM (Screw) ×6 IMPLANT
SCREW LOCKING STRL 4.75X25X3.5 (Screw) ×3 IMPLANT
SET HNDPC FAN SPRY TIP SCT (DISPOSABLE) ×1 IMPLANT
SLING ARM IMMOBILIZER LRG (SOFTGOODS) IMPLANT
SLING ARM IMMOBILIZER MED (SOFTGOODS) ×3 IMPLANT
SPONGE LAP 18X18 RF (DISPOSABLE) ×3 IMPLANT
STEM HUMERAL STRL 8MMX83MM (Stem) ×3 IMPLANT
STRIP CLOSURE SKIN 1/2X4 (GAUZE/BANDAGES/DRESSINGS) ×2 IMPLANT
SUCTION FRAZIER HANDLE 10FR (MISCELLANEOUS) ×2
SUCTION TUBE FRAZIER 10FR DISP (MISCELLANEOUS) ×1 IMPLANT
SUPPORT WRAP ARM LG (MISCELLANEOUS) ×3 IMPLANT
SUT BROADBAND TAPE 2PK 1.5 (SUTURE) ×6 IMPLANT
SUT ETHIBOND 2 OS 4 DA (SUTURE) IMPLANT
SUT ETHIBOND NAB CT1 #1 30IN (SUTURE) ×3 IMPLANT
SUT MAXBRAID #2 CVD NDL (SUTURE) ×3 IMPLANT
SUT MNCRL AB 4-0 PS2 18 (SUTURE) ×3 IMPLANT
SUT VIC AB 0 CT1 36 (SUTURE) IMPLANT
SUT VIC AB 2-0 CT1 27 (SUTURE) ×4
SUT VIC AB 2-0 CT1 TAPERPNT 27 (SUTURE) ×2 IMPLANT
TOWEL OR 17X26 10 PK STRL BLUE (TOWEL DISPOSABLE) ×6 IMPLANT
TOWEL OR NON WOVEN STRL DISP B (DISPOSABLE) ×3 IMPLANT
TOWER CARTRIDGE SMART MIX (DISPOSABLE) IMPLANT
TRAY HUM MINI SHOULDER +3 40 (Joint) ×3 IMPLANT
WATER STERILE IRR 1000ML POUR (IV SOLUTION) ×3 IMPLANT
YANKAUER SUCT BULB TIP 10FT TU (MISCELLANEOUS) ×3 IMPLANT

## 2018-10-19 NOTE — Op Note (Signed)
Procedure(s): REVERSE SHOULDER ARTHROPLASTY Procedure Note  Darlyn ChamberGloria Speranza female 73 y.o. 10/19/2018  Procedure(s) and Anesthesia Type:    * LEFT REVERSE SHOULDER ARTHROPLASTY - General   Indications:  73 y.o. female  With comminuted intra-articular four-part proximal humerus fracture after a fall.  Indicated for reverse total shoulder arthroplasty to maximize function and pain relief.     Surgeon: Berline LopesJustin W Keidan Aumiller   Assistants: Aundra MilletMegan RNFA  Anesthesia: General endotracheal anesthesia with preoperative interscalene block given by the attending anesthesiologist     Procedure Detail  REVERSE SHOULDER ARTHROPLASTY   Estimated Blood Loss:  200 mL         Drains: none  Blood Given: none          Specimens: none        Complications:  * No complications entered in OR log *         Disposition: PACU - hemodynamically stable.         Condition: stable      OPERATIVE FINDINGS:  A Biomet short stem, size 8 with a 40 glenosphere standard, +3 offset on the tray.  Excellent stability was achieved and was able to get a nice reconstruction of the tuberosities around the implant.  PROCEDURE: The patient was identified in the preoperative holding area  where I personally marked the operative site after verifying site, side,  and procedure with the patient. An interscalene block given by  the attending anesthesiologist in the holding area and the patient was taken back to the operating room where all extremities were  carefully padded in position after general anesthesia was induced. She  was placed in a beach-chair position and the operative upper extremity was  prepped and draped in a standard sterile fashion. An approximately 10-  cm incision was made from the tip of the coracoid process to the center  point of the humerus at the level of the axilla. Dissection was carried  down through subcutaneous tissues to the level of the cephalic vein  which was taken laterally with the  deltoid. The pectoralis major was  retracted medially. The subdeltoid space was developed and the lateral  edge of the conjoined tendon was identified. The undersurface of  conjoined tendon was palpated and the musculocutaneous nerve was not in  the field. Retractor was placed underneath the conjoined and second  retractor was placed lateral into the deltoid.  The biceps tendon was traced into the rotator interval which was opened sharply.  The biceps was tenotomized and the proximal segment was noted to be degenerative and was discarded.  The Cobb elevator was used to mobilize the bony fragments and 2 free fragments of articular surface were removed and a second larger articular fragment was osteotomized from the greater tuberosity and removed.  Small amount of remaining articular surface was removed from the lesser tuberosity fragment as well.  The tuberosities were then mobilized and controlled with sutures.  The glenoid was then exposed and the labrum was circumferentially removed and the cartilage was removed from the glenoid surface.  The central guidepin was placed and reamer was used to prepare the glenoid.  The glenoid baseplate was impacted and peripheral screws were measured in place with appropriate size locking screws.  The 40 standard glenosphere was then impacted.  The proximal humerus was then again exposed and prepared.  The size 8 short stem had a nice press-fit and was placed in 20 degrees retroversion.  The joint was reduced with a trial tray +3  offset.  This had a nice soft tissue tension and length with anatomic positioning of the tuberosities around the implant. Joint was dislocated and the final implant was placed press-fit with the same sizes.  The joint was then reduced and the tuberosities were reconstructed around the implant with 6 high tensile strength sutures. Some bone graft was placed beneath the tuberosities.  The final construct was felt to be excellent and very  stable.   The joint was then copiously irrigated and the wound was then closed.  Skin was closed with 2-0 Vicryl in a deep dermal layer and 4-0  Monocryl for skin closure. Steri-Strips were applied. Sterile  dressings were then applied as well as a sling. The patient was allowed  to awaken from general anesthesia, transferred to stretcher, and taken  to recovery room in stable condition.   POSTOPERATIVE PLAN: The patient will be kept in the hospital postoperatively  for pain control and therapy.

## 2018-10-19 NOTE — H&P (Signed)
Denise Ortega is an 73 y.o. female.   Chief Complaint: S/p fall with L shoulder injury   HPI: s/p fall with comminuted left proximal humerus fracture with multiple head splitting fragments.  Indicated for surgical treatment to try and promote the best possible outcome with function and pain relief.  Past Medical History:  Diagnosis Date  . Anemia   . Asthma   . Dyspnea   . Foot fracture, right   . Humerus fracture    left humerus  . Hypertension     Past Surgical History:  Procedure Laterality Date  . ABDOMINAL HYSTERECTOMY    . ANKLE FRACTURE SURGERY Right 1980  . EYE SURGERY Bilateral    Cataract  . MENISCUS REPAIR Bilateral   . TOTAL KNEE ARTHROPLASTY Right 02/22/2018   Procedure: RIGHT TOTAL KNEE ARTHROPLASTY;  Surgeon: Gean Birchwoodowan, Frank, MD;  Location: [facility];  Service: Orthopedics;  Laterality: Right;  . WRIST ARTHROSCOPY      History reviewed. No pertinent family history. Social History:  reports that she has quit smoking. She has never used smokeless tobacco. She reports that she does not drink alcohol or use drugs.  Allergies:  Allergies  Allergen Reactions  . Ativan [Lorazepam] Other (See Comments)    Mental disturbances, pt's mother-in-law Not patient.    Medications Prior to Admission  Medication Sig Dispense Refill  . albuterol (PROVENTIL HFA;VENTOLIN HFA) 108 (90 Base) MCG/ACT inhaler Inhale 2 puffs into the lungs every 6 (six) hours as needed for wheezing or shortness of breath.     Marland Kitchen. aspirin EC 81 MG tablet Take 1 tablet (81 mg total) by mouth 2 (two) times daily. (Patient taking differently: Take 81 mg by mouth daily. ) 60 tablet 0  . Calcium Carb-Cholecalciferol (CALCIUM 600 + D PO) Take 2 tablets by mouth daily.    . COLLAGEN-VITAMIN C PO Take 1 tablet by mouth daily.    Marland Kitchen. HYDROcodone-acetaminophen (NORCO/VICODIN) 5-325 MG tablet Take 1-2 tablets by mouth every 4 (four) hours as needed for moderate pain.    Marland Kitchen. ibuprofen (ADVIL) 200 MG tablet Take 600-800 mg by  mouth every 6 (six) hours as needed for headache or moderate pain.    . Iron-Vitamins (GERITOL TONIC PO) Take 5 mLs by mouth daily. Geritol Tonic with Ferrex    . polyvinyl alcohol (LUBRICANT DROPS) 1.4 % ophthalmic solution Place 1-2 drops into both eyes as needed for dry eyes.    Marland Kitchen. triamterene-hydrochlorothiazide (MAXZIDE-25) 37.5-25 MG tablet Take 1 tablet by mouth daily.  2    Results for orders placed or performed during the hospital encounter of 10/19/18 (from the past 48 hour(s))  SARS Coronavirus 2 (CEPHEID - Performed in [facility]), Hosp Order     Status: None   Collection Time: 10/19/18  7:46 AM  Result Value Ref Range   SARS Coronavirus 2 NEGATIVE NEGATIVE    Comment: (NOTE) If result is NEGATIVE SARS-CoV-2 target nucleic acids are NOT DETECTED. The SARS-CoV-2 RNA is generally detectable in upper and lower  respiratory specimens during the acute phase of infection. The lowest  concentration of SARS-CoV-2 viral copies this assay can detect is 250  copies / mL. A negative result does not preclude SARS-CoV-2 infection  and should not be used as the sole basis for treatment or other  patient management decisions.  A negative result may occur with  improper specimen collection / handling, submission of specimen other  than nasopharyngeal swab, presence of viral mutation(s) within the  areas targeted  by this assay, and inadequate number of viral copies  (<250 copies / mL). A negative result must be combined with clinical  observations, patient history, and epidemiological information. If result is POSITIVE SARS-CoV-2 target nucleic acids are DETECTED. The SARS-CoV-2 RNA is generally detectable in upper and lower  respiratory specimens dur ing the acute phase of infection.  Positive  results are indicative of active infection with SARS-CoV-2.  Clinical  correlation with patient history and other diagnostic information is  necessary to determine patient infection  status.  Positive results do  not rule out bacterial infection or co-infection with other viruses. If result is PRESUMPTIVE POSTIVE SARS-CoV-2 nucleic acids MAY BE PRESENT.   A presumptive positive result was obtained on the submitted specimen  and confirmed on repeat testing.  While 2019 novel coronavirus  (SARS-CoV-2) nucleic acids may be present in the submitted sample  additional confirmatory testing may be necessary for epidemiological  and / or clinical management purposes  to differentiate between  SARS-CoV-2 and other Sarbecovirus currently known to infect humans.  If clinically indicated additional testing with an alternate test  methodology [phone]) is advised. The SARS-CoV-2 RNA is generally  detectable in upper and lower respiratory sp ecimens during the acute  phase of infection. The expected result is Negative. Fact Sheet for Patients:  BoilerBrush.com.cy Fact Sheet for Healthcare Providers: https://pope.com/ This test is not yet approved or cleared by the [place] FDA and has been authorized for detection and/or diagnosis of SARS-CoV-2 by FDA under an Emergency Use Authorization (EUA).  This EUA will remain in effect (meaning this test can be used) for the duration of the COVID-19 declaration under Section 564(b)(1) of the Act, 21 U.S.C. section 360bbb-3(b)(1), unless the authorization is terminated or revoked sooner. Performed at [facility], [address], [place], [place] [address]    No results found.  Review of Systems  All other systems reviewed and are negative.   Blood pressure (!) 188/72, pulse 74, temperature 98.1 F (36.7 C), temperature source Oral, resp. rate 18, height 5\' 3"  (1.6 m), weight 77.1 kg, SpO2 98 %. Physical Exam  Constitutional: She is oriented to person, place, and time. She appears well-developed and well-nourished.  HENT:  Head: Atraumatic.  Eyes: EOM are  normal.  Cardiovascular: Intact distal pulses.  Respiratory: Effort normal.  Musculoskeletal:     Comments: LUE with Swelling and mild ecchymosis about shoulder. NVID.  Neurological: She is alert and oriented to person, place, and time.  Skin: Skin is warm and dry.  Psychiatric: She has a normal mood and affect.     Assessment/Plan Left displaced comminuted proximal humerus fracture with multiple head splitting fragments Plan left shoulder reverse total shoulder arthroplasty Risks / benefits of surgery discussed Consent on chart  NPO for OR Preop antibiotics   Berline Lopes, MD 10/19/2018, 10:05 AM

## 2018-10-19 NOTE — Discharge Instructions (Addendum)
Discharge Instructions after Reverse Total Shoulder Arthroplasty ° ° °A sling has been provided for you. You are to where this at all times, even while sleeping, until your first post operative visit with Dr. Plez Belton. °Use ice on the shoulder intermittently over the first 48 hours after surgery.  °Pain medicine has been prescribed for you.  °Use your medicine liberally over the first 48 hours, and then you can begin to taper your use. You may take Extra Strength Tylenol or Tylenol only in place of the pain pills. DO NOT take ANY nonsteroidal anti-inflammatory pain medications: Advil, Motrin, Ibuprofen, Aleve, Naproxen or Naprosyn.  °Take one aspirin a day for 2 weeks after surgery, unless you have an aspirin sensitivity/allergy or asthma.  °Leave your dressing on until your first follow up visit.  You may shower with the dressing.  Hold your arm as if you still have your sling on while you shower. °Simply allow the water to wash over the site and then pat dry. Make sure your axilla (armpit) is completely dry after showering. ° ° ° °Please call [phone] during normal business hours or [phone] after hours for any problems. Including the following: ° °- excessive redness of the incisions °- drainage for more than 4 days °- fever of more than 101.5 F ° °*Please note that pain medications will not be refilled after hours or on weekends. ° ° ° ° °

## 2018-10-19 NOTE — Transfer of Care (Signed)
Immediate Anesthesia Transfer of Care Note  Patient: Denise Ortega  Procedure(s) Performed: REVERSE SHOULDER ARTHROPLASTY (Left Shoulder)  Patient Location: PACU  Anesthesia Type:General  Level of Consciousness: awake  Airway & Oxygen Therapy: Patient Spontanous Breathing and Patient connected to face mask oxygen  Post-op Assessment: Report given to RN  Post vital signs: Reviewed and stable  Last Vitals:  Vitals Value Taken Time  BP 167/67 10/19/2018  2:37 PM  Temp    Pulse 82 10/19/2018  2:39 PM  Resp 19 10/19/2018  2:39 PM  SpO2 92 % 10/19/2018  2:39 PM  Vitals shown include unvalidated device data.  Last Pain:  Vitals:   10/19/18 1438  TempSrc:   PainSc: (P) 0-No pain         Complications: No apparent anesthesia complications

## 2018-10-19 NOTE — Progress Notes (Signed)
Assisted Dr. Woodrum with left, ultrasound guided, interscalene  block. Side rails up, monitors on throughout procedure. See vital signs in flow sheet. Tolerated Procedure well. 

## 2018-10-19 NOTE — Anesthesia Preprocedure Evaluation (Addendum)
Anesthesia Evaluation  Patient identified by MRN, date of birth, ID band Patient awake    Reviewed: Allergy & Precautions, NPO status , Patient's Chart, lab work & pertinent test results  Airway Mallampati: II  TM Distance: >3 FB Neck ROM: Full    Dental no notable dental hx. (+) Partial Upper   Pulmonary asthma , former smoker,    Pulmonary exam normal breath sounds clear to auscultation       Cardiovascular hypertension, negative cardio ROS Normal cardiovascular exam Rhythm:Regular Rate:Normal     Neuro/Psych negative neurological ROS  negative psych ROS   GI/Hepatic negative GI ROS, Neg liver ROS,   Endo/Other  negative endocrine ROS  Renal/GU negative Renal ROS  negative genitourinary   Musculoskeletal  (+) Arthritis ,   Abdominal   Peds negative pediatric ROS (+)  Hematology  (+) Blood dyscrasia (Hgb 8.8), anemia ,   Anesthesia Other Findings   Reproductive/Obstetrics negative OB ROS                            Anesthesia Physical Anesthesia Plan  ASA: II  Anesthesia Plan: General and Regional   Post-op Pain Management:  Regional for Post-op pain   Induction: Intravenous  PONV Risk Score and Plan: 3 and Dexamethasone, Ondansetron, Treatment may vary due to age or medical condition and Midazolam  Airway Management Planned: Oral ETT  Additional Equipment:   Intra-op Plan:   Post-operative Plan: Extubation in OR  Informed Consent: I have reviewed the patients History and Physical, chart, labs and discussed the procedure including the risks, benefits and alternatives for the proposed anesthesia with the patient or authorized representative who has indicated his/her understanding and acceptance.     Dental advisory given  Plan Discussed with: CRNA  Anesthesia Plan Comments:         Anesthesia Quick Evaluation

## 2018-10-19 NOTE — Anesthesia Procedure Notes (Signed)
Procedure Name: Intubation Date/Time: 10/19/2018 12:32 PM Performed by: Eben Burow, CRNA Pre-anesthesia Checklist: Patient identified, Emergency Drugs available, Suction available and Patient being monitored Patient Re-evaluated:Patient Re-evaluated prior to induction Oxygen Delivery Method: Circle system utilized Preoxygenation: Pre-oxygenation with 100% oxygen Induction Type: IV induction and Rapid sequence Laryngoscope Size: Mac and 4 Grade View: Grade I Tube type: Oral Tube size: 7.0 mm Number of attempts: 1 Airway Equipment and Method: Stylet and Oral airway Placement Confirmation: ETT inserted through vocal cords under direct vision,  positive ETCO2 and breath sounds checked- equal and bilateral Secured at: 21 cm Tube secured with: Tape Dental Injury: Teeth and Oropharynx as per pre-operative assessment

## 2018-10-19 NOTE — Anesthesia Procedure Notes (Signed)
Anesthesia Regional Block: Interscalene brachial plexus block   Pre-Anesthetic Checklist: ,, timeout performed, Correct Patient, Correct Site, Correct Laterality, Correct Procedure, Correct Position, site marked, Risks and benefits discussed,  Surgical consent,  Pre-op evaluation,  At surgeon's request and post-op pain management  Laterality: Left  Prep: Maximum Sterile Barrier Precautions used, chloraprep       Needles:  Injection technique: Single-shot  Needle Type: Echogenic Stimulator Needle     Needle Length: 9cm  Needle Gauge: 22     Additional Needles:   Procedures:,,,, ultrasound used (permanent image in chart),,,,  Narrative:  Start time: 10/19/2018 11:04 AM End time: 10/19/2018 11:14 AM Injection made incrementally with aspirations every 5 mL.  Performed by: Personally  Anesthesiologist: Elmer Picker, MD  Additional Notes: Monitors applied. No increased pain on injection. No increased resistance to injection. Injection made in 5cc increments. Good needle visualization. Patient tolerated procedure well.

## 2018-10-20 ENCOUNTER — Encounter (HOSPITAL_COMMUNITY): Payer: Self-pay | Admitting: Orthopedic Surgery

## 2018-10-20 ENCOUNTER — Other Ambulatory Visit: Payer: Self-pay

## 2018-10-20 DIAGNOSIS — D62 Acute posthemorrhagic anemia: Secondary | ICD-10-CM

## 2018-10-20 DIAGNOSIS — I160 Hypertensive urgency: Secondary | ICD-10-CM | POA: Diagnosis present

## 2018-10-20 DIAGNOSIS — Z96619 Presence of unspecified artificial shoulder joint: Secondary | ICD-10-CM

## 2018-10-20 DIAGNOSIS — Z96612 Presence of left artificial shoulder joint: Secondary | ICD-10-CM

## 2018-10-20 LAB — URINALYSIS, ROUTINE W REFLEX MICROSCOPIC
Bilirubin Urine: NEGATIVE
Glucose, UA: NEGATIVE mg/dL
Hgb urine dipstick: NEGATIVE
Ketones, ur: NEGATIVE mg/dL
Leukocytes,Ua: NEGATIVE
Nitrite: NEGATIVE
Protein, ur: NEGATIVE mg/dL
Specific Gravity, Urine: 1.014 (ref 1.005–1.030)
pH: 7 (ref 5.0–8.0)

## 2018-10-20 LAB — CBC
HCT: 24.1 % — ABNORMAL LOW (ref 36.0–46.0)
Hemoglobin: 7.1 g/dL — ABNORMAL LOW (ref 12.0–15.0)
MCH: 23.1 pg — ABNORMAL LOW (ref 26.0–34.0)
MCHC: 29.5 g/dL — ABNORMAL LOW (ref 30.0–36.0)
MCV: 78.5 fL — ABNORMAL LOW (ref 80.0–100.0)
Platelets: 314 10*3/uL (ref 150–400)
RBC: 3.07 MIL/uL — ABNORMAL LOW (ref 3.87–5.11)
RDW: 15.4 % (ref 11.5–15.5)
WBC: 11.9 10*3/uL — ABNORMAL HIGH (ref 4.0–10.5)
nRBC: 0 % (ref 0.0–0.2)

## 2018-10-20 LAB — BASIC METABOLIC PANEL
Anion gap: 10 (ref 5–15)
BUN: 18 mg/dL (ref 8–23)
CO2: 21 mmol/L — ABNORMAL LOW (ref 22–32)
Calcium: 8.2 mg/dL — ABNORMAL LOW (ref 8.9–10.3)
Chloride: 107 mmol/L (ref 98–111)
Creatinine, Ser: 0.6 mg/dL (ref 0.44–1.00)
GFR calc Af Amer: >60 mL/min (ref >60)
GFR calc non Af Amer: >60 mL/min (ref >60)
Glucose, Bld: 119 mg/dL — ABNORMAL HIGH (ref 70–99)
Potassium: 3.5 mmol/L (ref 3.5–5.1)
Sodium: 138 mmol/L (ref 135–145)

## 2018-10-20 LAB — PREPARE RBC (CROSSMATCH)

## 2018-10-20 MED ORDER — HYDRALAZINE HCL 20 MG/ML IJ SOLN: 10.0000 mg | Freq: Four times a day (QID) | INTRAMUSCULAR | Status: DC | PRN

## 2018-10-20 MED ORDER — HYDROCODONE-ACETAMINOPHEN 5-325 MG PO TABS: 1.0000 | ORAL_TABLET | ORAL | 0 refills | Status: DC | PRN

## 2018-10-20 MED ORDER — HYDRALAZINE HCL 25 MG PO TABS
25.0000 mg | ORAL_TABLET | Freq: Once | ORAL | Status: AC
  Administered 2018-10-20: 20:00:00 25 mg via ORAL
  Filled 2018-10-20: qty 1

## 2018-10-20 MED ORDER — LABETALOL HCL 100 MG PO TABS: 200.0000 mg | ORAL_TABLET | Freq: Three times a day (TID) | ORAL | Status: DC

## 2018-10-20 MED ORDER — HYDRALAZINE HCL 20 MG/ML IJ SOLN: 10.0000 mg | Freq: Once | INTRAMUSCULAR | Status: DC

## 2018-10-20 MED ORDER — SODIUM CHLORIDE 0.9% IV SOLUTION
Freq: Once | INTRAVENOUS | Status: AC
  Administered 2018-10-20: 09:00:00 via INTRAVENOUS

## 2018-10-20 MED ORDER — AMLODIPINE BESYLATE 10 MG PO TABS
10.0000 mg | ORAL_TABLET | Freq: Every day | ORAL | Status: DC
  Administered 2018-10-20 – 2018-10-21 (×2): 10 mg via ORAL
  Filled 2018-10-20 (×2): qty 1

## 2018-10-20 NOTE — Consult Note (Signed)
Medical Consultation   Denise Ortega  ZOX:[id number]  DOB: 1946-01-08  DOA: 10/19/2018  PCP: System, Pcp Not In  Outpatient Specialists:    Requesting physician: Orthopedics: Dr. Dub Mikes   Reason for consultation: Hypertensive urgency   History of Present Illness: Denise Ortega is an 73 y.o. female with history of hypertension, right foot fracture, iron deficiency anemia, prior history of asthma, who was admitted to the orthopedic service after recent commuted left proximal humeral fracture with multiple head splitting fragments after a fall.  Patient was admitted on 10/19/2018 for operative treatment of left commuted proximal humeral fracture and underwent reverse shoulder arthroplasty on 10/19/2018.  Patient tolerated procedure well.  Patient noted to be anemic postoperatively with a hemoglobin dropping as low as 7.1.  Patient was transfused a unit of packed red blood cells and was subsequently to be discharged home when patient noted to have blood pressure of 200/80.  Blood pressure was repeated manually per RN and noted to be 202/78.  Discharge was canceled and the hospitalist service was called for consultation on patient's hypertensive urgency. Patient denies any blurry vision, no headaches, no chest pain, no shortness of breath, no fever, no chills, no nausea, no vomiting, no abdominal pain, no melena, no hematemesis, no hematochezia, no diarrhea, no constipation, no lightheadedness, no asymmetric weakness or numbness, no dysuria.  No complaints.      Review of Systems:  ROS As per HPI otherwise 10 point review of systems negative.    Past Medical History: Past Medical History:  Diagnosis Date  . Anemia   . Asthma   . Dyspnea   . Foot fracture, right   . Humerus fracture    left humerus  . Hypertension     Past Surgical History: Past Surgical History:  Procedure Laterality Date  . ABDOMINAL HYSTERECTOMY    . ANKLE FRACTURE SURGERY Right 1980   . EYE SURGERY Bilateral    Cataract  . MENISCUS REPAIR Bilateral   . REVERSE SHOULDER ARTHROPLASTY Left 10/19/2018   Procedure: REVERSE SHOULDER ARTHROPLASTY;  Surgeon: Jones Broom, MD;  Location: [facility];  Service: Orthopedics;  Laterality: Left;  . TOTAL KNEE ARTHROPLASTY Right 02/22/2018   Procedure: RIGHT TOTAL KNEE ARTHROPLASTY;  Surgeon: Gean Birchwood, MD;  Location: [facility];  Service: Orthopedics;  Laterality: Right;  . WRIST ARTHROSCOPY       Allergies:   Allergies  Allergen Reactions  . Ativan [Lorazepam] Other (See Comments)    Mental disturbances, pt's mother-in-law Not patient.     Social History:  reports that she has quit smoking. She has never used smokeless tobacco. She reports that she does not drink alcohol or use drugs.   Family History: History reviewed. No pertinent family history.  Family history reviewed.  Patient states mother deceased age 40 and not quite sure what she was deceased from.  Father deceased age 21 from an acute CVA.   Physical Exam: Vitals:   10/20/18 1630 10/20/18 1715 10/20/18 1801 10/20/18 1806  BP: (!) 203/85 (!) 201/75 (!) 190/72 (!) 202/78  Pulse: 77  79   Resp: 18     Temp: 98.4 F (36.9 C)     TempSrc: Oral     SpO2: 100%  99%   Weight:      Height:        Constitutional: Appearance,  Alert and awake, oriented x3, not in any acute distress. Eyes: PERLA, EOMI,  irises appear normal, anicteric sclera,  ENMT: external ears and nose appear normal, normal hearing or hard of hearing            Lips appears normal, oropharynx mucosa, tongue, posterior pharynx appear normal  Neck: neck appears normal, no masses, normal ROM, no thyromegaly, no JVD  CVS: S1-S2 clear, no murmur rubs or gallops, no LE edema, normal pedal pulses  Respiratory:  clear to auscultation bilaterally, no wheezing, rales or rhonchi. Respiratory effort normal. No accessory muscle use.  Abdomen: soft nontender, nondistended, normal bowel sounds, no  hepatosplenomegaly, no hernias  Musculoskeletal: : no cyanosis, clubbing or edema noted bilaterally.  Left upper extremity in a sling.  Right lower extremity in a CAM boot. Neuro: Cranial nerves II-XII intact, strength, sensation, reflexes Psych: judgement and insight appear normal, stable mood and affect, mental status Skin: no rashes or lesions or ulcers, no induration or nodules   Data reviewed:  I have personally reviewed following labs and imaging studies Labs:  CBC: Recent Labs  Lab 10/19/18 1000 10/20/18 0357  WBC 10.9* 11.9*  HGB 8.8* 7.1*  HCT 30.2* 24.1*  MCV 80.1 78.5*  PLT 401* 314    Basic Metabolic Panel: Recent Labs  Lab 10/19/18 1000 10/20/18 0357  NA 136 138  K 5.6* 3.5  CL 103 107  CO2 23 21*  GLUCOSE 113* 119*  BUN 25* 18  CREATININE 0.81 0.60  CALCIUM 8.8* 8.2*   GFR Estimated Creatinine Clearance: 62.5 mL/min (by C-G formula based on SCr of 0.6 mg/dL). Liver Function Tests: No results for input(s): AST, ALT, ALKPHOS, BILITOT, PROT, ALBUMIN in the last 168 hours. No results for input(s): LIPASE, AMYLASE in the last 168 hours. No results for input(s): AMMONIA in the last 168 hours. Coagulation profile No results for input(s): INR, PROTIME in the last 168 hours.  Cardiac Enzymes: No results for input(s): CKTOTAL, CKMB, CKMBINDEX, TROPONINI in the last 168 hours. BNP: Invalid input(s): POCBNP CBG: No results for input(s): GLUCAP in the last 168 hours. D-Dimer No results for input(s): DDIMER in the last 72 hours. Hgb A1c No results for input(s): HGBA1C in the last 72 hours. Lipid Profile No results for input(s): CHOL, HDL, LDLCALC, TRIG, CHOLHDL, LDLDIRECT in the last 72 hours. Thyroid function studies No results for input(s): TSH, T4TOTAL, T3FREE, THYROIDAB in the last 72 hours.  Invalid input(s): FREET3 Anemia work up No results for input(s): VITAMINB12, FOLATE, FERRITIN, TIBC, IRON, RETICCTPCT in the last 72 hours. Urinalysis     Component Value Date/Time   COLORURINE YELLOW 02/14/2018 1128   APPEARANCEUR CLEAR 02/14/2018 1128   LABSPEC 1.023 02/14/2018 1128   PHURINE 6.0 02/14/2018 1128   GLUCOSEU NEGATIVE 02/14/2018 1128   HGBUR SMALL (A) 02/14/2018 1128   BILIRUBINUR NEGATIVE 02/14/2018 1128   KETONESUR NEGATIVE 02/14/2018 1128   PROTEINUR NEGATIVE 02/14/2018 1128   NITRITE NEGATIVE 02/14/2018 1128   LEUKOCYTESUR NEGATIVE 02/14/2018 1128     Microbiology Recent Results (from the past 240 hour(s))  SARS Coronavirus 2 (CEPHEID - Performed in [facility] hospital lab), Hosp Order     Status: None   Collection Time: 10/19/18  7:46 AM  Result Value Ref Range Status   SARS Coronavirus 2 NEGATIVE NEGATIVE Final    Comment: (NOTE) If result is NEGATIVE SARS-CoV-2 target nucleic acids are NOT DETECTED. The SARS-CoV-2 RNA is generally detectable in upper and lower  respiratory specimens during the acute phase of infection. The lowest  concentration of SARS-CoV-2 viral copies this assay can  detect is 250  copies / mL. A negative result does not preclude SARS-CoV-2 infection  and should not be used as the sole basis for treatment or other  patient management decisions.  A negative result may occur with  improper specimen collection / handling, submission of specimen other  than nasopharyngeal swab, presence of viral mutation(s) within the  areas targeted by this assay, and inadequate number of viral copies  (<250 copies / mL). A negative result must be combined with clinical  observations, patient history, and epidemiological information. If result is POSITIVE SARS-CoV-2 target nucleic acids are DETECTED. The SARS-CoV-2 RNA is generally detectable in upper and lower  respiratory specimens dur ing the acute phase of infection.  Positive  results are indicative of active infection with SARS-CoV-2.  Clinical  correlation with patient history and other diagnostic information is  necessary to determine patient  infection status.  Positive results do  not rule out bacterial infection or co-infection with other viruses. If result is PRESUMPTIVE POSTIVE SARS-CoV-2 nucleic acids MAY BE PRESENT.   A presumptive positive result was obtained on the submitted specimen  and confirmed on repeat testing.  While 2019 novel coronavirus  (SARS-CoV-2) nucleic acids may be present in the submitted sample  additional confirmatory testing may be necessary for epidemiological  and / or clinical management purposes  to differentiate between  SARS-CoV-2 and other Sarbecovirus currently known to infect humans.  If clinically indicated additional testing with an alternate test  methodology [phone]) is advised. The SARS-CoV-2 RNA is generally  detectable in upper and lower respiratory sp ecimens during the acute  phase of infection. The expected result is Negative. Fact Sheet for Patients:  BoilerBrush.com.cy Fact Sheet for Healthcare Providers: https://pope.com/ This test is not yet approved or cleared by the [place] FDA and has been authorized for detection and/or diagnosis of SARS-CoV-2 by FDA under an Emergency Use Authorization (EUA).  This EUA will remain in effect (meaning this test can be used) for the duration of the COVID-19 declaration under Section 564(b)(1) of the Act, 21 U.S.C. section 360bbb-3(b)(1), unless the authorization is terminated or revoked sooner. Performed at [facility], [address], [place], [place] [address]        Inpatient Medications:   Scheduled Meds: . amLODipine  10 mg Oral Daily  . aspirin EC  325 mg Oral BID  . docusate sodium  100 mg Oral BID  . hydrALAZINE  25 mg Oral Once  . triamterene-hydrochlorothiazide  1 tablet Oral Daily   Continuous Infusions: . methocarbamol (ROBAXIN) IV       Radiological Exams on Admission: Dg Shoulder Left Port  Result Date: 10/19/2018 CLINICAL DATA:  The  patient suffered a fall with a comminuted proximal left humerus fracture. Status post left shoulder replacement. Initial encounter. EXAM: LEFT SHOULDER - 1 VIEW COMPARISON:  None. FINDINGS: Reverse shoulder arthroplasty is in place. The device is located. No acute bony abnormality is seen. IMPRESSION: Status post left shoulder replacement.  No acute finding. Electronically Signed   By: Drusilla Kanner M.D.   On: 10/19/2018 15:09    Impression/Recommendations Principal Problem:   S/P reverse total shoulder arthroplasty, left Active Problems:   Hypertensive urgency   Osteoarthritis of right knee   Acute blood loss anemia   1 hypertensive urgency Patient noted to have systolic blood pressures in the 200s just prior to discharge post transfusion of a unit of packed red blood cells.  Patient with no visual changes, no headaches,  no focal neurological deficits.  Patient denies any chest pain or shortness of breath.  No abdominal pain.  Patient noted to be on Maxide prior to admission and stated she was supposed to be on another antihypertensive medication however cannot remember the name and has not been on it for several weeks.  Patient states systolic blood pressures usually runs in the 150s to the 160s.  Patient denies any significant acute pain.  Check a EKG.  Repeat a basic metabolic profile and a CBC and a magnesium level in the morning.  Check a UA.  Continue Maxide.  Will place on Norvasc 10 mg daily.  IV hydralazine as needed.  Patient currently with no IV site and as such we will give hydralazine 25 mg p.o. x1.  If further blood pressure control is needed may consider the addition of an ACE inhibitor.  Check vitals every 4 hours for the next 24 hours.  Goal blood pressure less than 160 in the next 24 to 48 hours.  Will need close outpatient follow-up with PCP.  2.  Acute blood loss anemia/iron deficiency anemia Status post 1 unit packed red blood cells.  Follow H&H.  3.  Comminuted left  proximal humerus fracture status post reverse total shoulder arthroplasty, left Per primary team.  Thank you for this consultation.  [facility] team will follow the patient with you.   Time Spent: 60 minutes  Ramiro Harvestaniel Thompson M.D. [facility] 10/20/2018, 6:58 PM

## 2018-10-20 NOTE — Discharge Summary (Addendum)
Patient ID: Denise Ortega MRN: [id number] DOB/AGE: 1945-09-11 73 y.o.  Admit date: 10/19/2018 Discharge date: 10/21/2018  Admission Diagnoses:  Principal Problem:   S/P reverse total shoulder arthroplasty, left Active Problems:   Osteoarthritis of right knee   Hypertensive urgency   Acute blood loss anemia   Status post total shoulder arthroplasty   Discharge Diagnoses:  Same  Past Medical History:  Diagnosis Date  . Anemia   . Asthma   . Dyspnea   . Foot fracture, right   . Humerus fracture    left humerus  . Hypertension     Surgeries: Procedure(s): REVERSE SHOULDER ARTHROPLASTY on 10/19/2018   Consultants:   Discharged Condition: Improved  Hospital Course: Denise Ortega is an 73 y.o. female who was admitted 10/19/2018 for operative treatment of L comminuted proximal humerus fracture.  Patient has severe unremitting pain that affects sleep, daily activities, and work/hobbies. After pre-op clearance the patient was taken to the operating room on 10/19/2018 and underwent  Procedure(s): REVERSE SHOULDER ARTHROPLASTY.     She tolerated the procedure well.  She did develop acute postoperative anemia and was transfused 1 unit of PRBCs.  On postoperative day 1 she was planned for discharge however her blood pressure spiked to the low 200s.  This was on multiple repeat checks.  Tried hospitalist team was consulted and they started her on amlodipine.  Her blood pressure came down to the 160s.  Patient was asymptomatic with regard to her hypertensive urgency.  Patient was seen this morning and is without any hypertensive symptoms.  She is ready for discharge.  Blood pressures in the 160s.  The amlodipine has been sent to her pharmacy.  Strict follow-up with her primary care physician was instructed.  She also knows to return to the emergency department with any headaches, shortness of breath, dizziness.  Patient was given perioperative antibiotics:  Anti-infectives (From admission,  onward)   Start     Dose/Rate Route Frequency Ordered Stop   10/19/18 1830  ceFAZolin (ANCEF) IVPB 2g/100 mL premix     2 g 200 mL/hr over 30 Minutes Intravenous Every 6 hours 10/19/18 1554 10/20/18 0546   10/19/18 0930  ceFAZolin (ANCEF) IVPB 2g/100 mL premix     2 g 200 mL/hr over 30 Minutes Intravenous On call to O.R. 10/19/18 0925 10/19/18 1233      She has a history of chronic anemia and postoperatively was noted to come down to a hemoglobin of 7.1.  After discussion of risks and benefits she elected to have 1 unit transfused packed red blood cells.   Patient was given sequential compression devices, early ambulation, and chemoprophylaxis to prevent DVT.  Patient benefited maximally from hospital stay and there were no complications.    Recent vital signs:  Patient Vitals for the past 24 hrs:  BP Temp Temp src Pulse Resp SpO2  10/21/18 0533 (!) 146/64 97.6 F (36.4 C) Oral 63 18 96 %  10/21/18 0213 (!) 152/55 97.8 F (36.6 C) Oral (!) 56 18 98 %  10/20/18 2054 (!) 173/67 98 F (36.7 C) Oral 67 16 99 %  10/20/18 1938 (!) 203/77 - - 69 - -  10/20/18 1806 (!) 202/78 - - - - -  10/20/18 1801 (!) 190/72 - - 79 - 99 %  10/20/18 1715 (!) 201/75 - - - - -  10/20/18 1630 (!) 203/85 98.4 F (36.9 C) Oral 77 18 100 %  10/20/18 1313 (!) 154/59 98.2 F (36.8 C) Oral 66 16  99 %  10/20/18 1248 (!) 187/76 97.8 F (36.6 C) Oral 77 16 100 %  10/20/18 1043 (!) 176/75 97.8 F (36.6 C) Oral 82 18 100 %     Recent laboratory studies:  Recent Labs    10/20/18 0357 10/21/18 0347  WBC 11.9* 11.8*  HGB 7.1* 8.7*  HCT 24.1* 28.2*  PLT 314 313  NA 138 138  K 3.5 3.4*  CL 107 105  CO2 21* 23  BUN 18 19  CREATININE 0.60 0.61  GLUCOSE 119* 109*  CALCIUM 8.2* 8.5*     Discharge Medications:   Allergies as of 10/21/2018      Reactions   Ativan [lorazepam] Other (See Comments)   Mental disturbances, pt's mother-in-law Not patient.      Medication List    STOP taking these  medications   ibuprofen 200 MG tablet Commonly known as:  ADVIL     TAKE these medications   albuterol 108 (90 Base) MCG/ACT inhaler Commonly known as:  VENTOLIN HFA Inhale 2 puffs into the lungs every 6 (six) hours as needed for wheezing or shortness of breath.   amLODipine 10 MG tablet Commonly known as:  NORVASC Take 1 tablet (10 mg total) by mouth daily for 14 days. Start taking on:  Oct 22, 2018   aspirin EC 81 MG tablet Take 1 tablet (81 mg total) by mouth 2 (two) times daily. What changed:  when to take this   CALCIUM 600 + D PO Take 2 tablets by mouth daily.   COLLAGEN-VITAMIN C PO Take 1 tablet by mouth daily.   GERITOL TONIC PO Take 5 mLs by mouth daily. Geritol Tonic with Ferrex   HYDROcodone-acetaminophen 5-325 MG tablet Commonly known as:  NORCO/VICODIN Take 1-2 tablets by mouth every 4 (four) hours as needed for moderate pain.   Lubricant Drops 1.4 % ophthalmic solution Generic drug:  polyvinyl alcohol Place 1-2 drops into both eyes as needed for dry eyes.   triamterene-hydrochlorothiazide 37.5-25 MG tablet Commonly known as:  MAXZIDE-25 Take 1 tablet by mouth daily.       Diagnostic Studies: Dg Shoulder Left Port  Result Date: 10/19/2018 CLINICAL DATA:  The patient suffered a fall with a comminuted proximal left humerus fracture. Status post left shoulder replacement. Initial encounter. EXAM: LEFT SHOULDER - 1 VIEW COMPARISON:  None. FINDINGS: Reverse shoulder arthroplasty is in place. The device is located. No acute bony abnormality is seen. IMPRESSION: Status post left shoulder replacement.  No acute finding. Electronically Signed   By: Drusilla Kannerhomas  Dalessio M.D.   On: 10/19/2018 15:09    Disposition: Discharge disposition: 01-Home or Self Care       Discharge Instructions    Call MD / Call 911   Complete by:  As directed    If you experience chest pain or shortness of breath, CALL 911 and be transported to the hospital emergency room.  If you  develope a fever above 101 F, pus (white drainage) or increased drainage or redness at the wound, or calf pain, call your surgeon's office.   Call MD / Call 911   Complete by:  As directed    If you experience chest pain or shortness of breath, CALL 911 and be transported to the hospital emergency room.  If you develope a fever above 101 F, pus (white drainage) or increased drainage or redness at the wound, or calf pain, call your surgeon's office.   Constipation Prevention   Complete by:  As directed  Drink plenty of fluids.  Prune juice may be helpful.  You may use a stool softener, such as Colace (over the counter) 100 mg twice a day.  Use MiraLax (over the counter) for constipation as needed.   Constipation Prevention   Complete by:  As directed    Drink plenty of fluids.  Prune juice may be helpful.  You may use a stool softener, such as Colace (over the counter) 100 mg twice a day.  Use MiraLax (over the counter) for constipation as needed.   Diet - low sodium heart healthy   Complete by:  As directed    Diet - low sodium heart healthy   Complete by:  As directed    Driving restrictions   Complete by:  As directed    No driving for 6 weeks   Increase activity slowly as tolerated   Complete by:  As directed    Increase activity slowly as tolerated   Complete by:  As directed       Follow-up Information    Call in 2 weeks to follow up.            Signed: Terance Hart 10/21/2018, 8:43 AM

## 2018-10-20 NOTE — Evaluation (Signed)
Occupational Therapy Evaluation and Discharge Patient Details Name: Denise Ortega MRN: [id number] DOB: 1946-04-13 Today's Date: 10/20/2018    History of Present Illness 73 yo female admitted s/p fall and now s/p LEFT REVERSE SHOULDER ARTHROPLASTY and RLE fx in boot. PHMx: RTKR 02/22/18, anemia, asthma, HTN, R ankle fx surgrey, Bil meniscus repairs.   Clinical Impression   This 73 yo female admitted and underwent above presents to acute OT with all education completed with pt and family (via video chat on patient's phone). Pt and family aware I am recommending that she have a seat to sit on in the shower and insurance does not pay for it. Pt to have boot and sling on when stepping into and out of tub and both on as well anytime she is up and about. Post op shoulder handouts were provided.No further OT needs, we will sign off.    Follow Up Recommendations  No OT follow up;Supervision/Assistance - 24 hour    Equipment Recommendations  None recommended by OT       Precautions / Restrictions Precautions Precautions: Shoulder;Fall Type of Shoulder Precautions: No AROM/PROM of shoulder; E-W-H exercises OK Shoulder Interventions: Shoulder sling/immobilizer;Off for dressing/bathing/exercises Precaution Booklet Issued: Yes (comment) Required Braces or Orthoses: Sling;Other Brace Other Brace: right low profile cam boot Restrictions Weight Bearing Restrictions: Yes LUE Weight Bearing: Non weight bearing RLE Weight Bearing: Weight bearing as tolerated(in boot)      Mobility Bed Mobility Overal bed mobility: Needs Assistance Bed Mobility: Supine to Sit     Supine to sit: Min assist     General bed mobility comments: Recommended she sleep in recliner at first  Transfers Overall transfer level: Needs assistance Equipment used: 1 person hand held assist Transfers: Sit to/from [employer] Sit to Stand: Min assist Stand pivot transfers: Min assist       General transfer  comment: ambulation with min  A HHA    Balance Overall balance assessment: Needs assistance Sitting-balance support: No upper extremity supported;Feet supported Sitting balance-Leahy Scale: Good     Standing balance support: Single extremity supported Standing balance-Leahy Scale: Poor                             ADL either performed or assessed with clinical judgement         Vision Patient Visual Report: No change from baseline              Pertinent Vitals/Pain Pain Assessment: Faces Faces Pain Scale: Hurts a little bit Pain Location: left shoulder Pain Descriptors / Indicators: Aching;Sore Pain Intervention(s): Limited activity within patient's tolerance;Monitored during session;Repositioned;Ice applied     Hand Dominance Right   Extremity/Trunk Assessment Upper Extremity Assessment Upper Extremity Assessment: LUE deficits/detail LUE Deficits / Details: shoulder sx this admission, elbow to hand WNL           Communication Communication Communication: No difficulties   Cognition Arousal/Alertness: Awake/alert Behavior During Therapy: WFL for tasks assessed/performed Overall Cognitive Status: Within Functional Limits for tasks assessed                                        Exercises Other Exercises Other Exercises: Pt completed 10 reps of AROM for elbow, wrist, forearm, and hand exercises while seated EOB   Shoulder Instructions Shoulder Instructions Donning/doffing shirt without moving shoulder: Patient able to independently  direct caregiver Method for sponge bathing under operated UE: Caregiver independent with task;Patient able to independently direct caregiver Donning/doffing sling/immobilizer: Patient able to independently direct caregiver Correct positioning of sling/immobilizer: Patient able to independently direct caregiver Pendulum exercises (written home exercise program): (NA) ROM for elbow, wrist and digits of  operated UE: Independent Sling wearing schedule (on at all times/off for ADL's): Independent Proper positioning of operated UE when showering: Independent Dressing change: (NA) Positioning of UE while sleeping: Patient able to independently direct caregiver    Home Living Family/patient expects to be discharged to:: Private residence Living Arrangements: Spouse/significant other Available Help at Discharge: Family Type of Home: House Home Access: Stairs to enter Secretary/administratorntrance Stairs-Number of Steps: 5 Entrance Stairs-Rails: Right Home Layout: One level     Bathroom Shower/Tub: Chief Strategy OfficerTub/shower unit   Bathroom Toilet: Standard Bathroom Accessibility: Yes   Home Equipment: None          Prior Functioning/Environment Level of Independence: Independent                 OT Problem List: Decreased strength;Decreased range of motion;Impaired balance (sitting and/or standing);Pain;Decreased knowledge of precautions         OT Goals(Current goals can be found in the care plan section) Acute Rehab OT Goals Patient Stated Goal: home today  OT Frequency:                AM-PAC OT "6 Clicks" Daily Activity     Outcome Measure Help from another person eating meals?: A Little Help from another person taking care of personal grooming?: A Lot Help from another person toileting, which includes using toliet, bedpan, or urinal?: A Lot Help from another person bathing (including washing, rinsing, drying)?: A Lot Help from another person to put on and taking off regular upper body clothing?: A Lot Help from another person to put on and taking off regular lower body clothing?: Total 6 Click Score: 12   End of Session Equipment Utilized During Treatment: (sling) Nurse Communication: Mobility status(NT)  Activity Tolerance: Patient tolerated treatment well Patient left: in chair;with call bell/phone within reach  OT Visit Diagnosis: Other abnormalities of gait and mobility (R26.89);Muscle  weakness (generalized) (M62.81);Pain Pain - Right/Left: Left Pain - part of body: Shoulder                Time: [id number] OT Time Calculation (min): 78 min Charges:  OT General Charges $OT Visit: 1 Visit OT Evaluation $OT Eval Moderate Complexity: 1 Mod OT Treatments $Self Care/Home Management : 38-52 mins $Therapeutic Exercise: 8-22 mins  Ignacia Palmaathy Prynce Jacober, OTR/L [facility] Pager [phone] Office [phone]     Evette GeorgesLeonard, Durelle Zepeda Eva 10/20/2018, 11:49 AM

## 2018-10-20 NOTE — Care Plan (Signed)
Patient is known to me in the office from prior surgery. I will follow along with her post op for transition needs. She will dishcarge to home with family. No HH or DME needs at present. OPPT will be arranged at first post op visit    Shauna Hugh, [facility]  [phone]

## 2018-10-20 NOTE — Progress Notes (Signed)
PAged on call MD about patient's high blood pressure. Denise Burkitt, MD returned page, and stated he was going to check with Dr. Ave Filter about her BP, and most likely call medicine team.   Called patient's husband to inform him about what was going on with his wife. Patient's family arrived at 29 to hospital main entrance to pick up patient. RN assessing patient after blood was finished and blood pressure was elevated in the 200's.   RN educated patient about why patient needs to stay until I hear back from the medical team. Patient upset that they may have to stay, but patient's family stated they want her here if she needs to stay. Will continue to monitor, and continue to update patient/family, and call on-call MD as needed.

## 2018-10-20 NOTE — Anesthesia Postprocedure Evaluation (Signed)
Anesthesia Post Note  Patient: Easter Simpson  Procedure(s) Performed: REVERSE SHOULDER ARTHROPLASTY (Left Shoulder)     Patient location during evaluation: PACU Anesthesia Type: Regional and General Level of consciousness: awake and alert Pain management: pain level controlled Vital Signs Assessment: post-procedure vital signs reviewed and stable Respiratory status: spontaneous breathing, nonlabored ventilation, respiratory function stable and patient connected to nasal cannula oxygen Cardiovascular status: blood pressure returned to baseline and stable Postop Assessment: no apparent nausea or vomiting Anesthetic complications: no    Last Vitals:  Vitals:   10/20/18 0205 10/20/18 0507  BP: (!) 147/54 (!) 153/66  Pulse: 80 85  Resp: 18 20  Temp: 36.7 C 36.7 C  SpO2: 94% 94%    Last Pain:  Vitals:   10/20/18 0507  TempSrc: Oral  PainSc: 0-No pain   Pain Goal:                   Patricia Fargo L Honestii Marton

## 2018-10-20 NOTE — Progress Notes (Signed)
   PATIENT ID: Denise Ortega   1 Day Post-Op Procedure(s) (LRB): REVERSE SHOULDER ARTHROPLASTY (Left)  Subjective: The patient is comfortable and pain is well-controlled with the block which continues to work well.  She notes that the boot that she was given previously for the fracture and her foot does not fit well and requested a change.She was able to walk to the bathroom this morni  Objective:  Vitals:   10/20/18 0205 10/20/18 0507  BP: (!) 147/54 (!) 153/66  Pulse: 80 85  Resp: 18 20  Temp: 98.1 F (36.7 C) 98.1 F (36.7 C)  SpO2: 94% 94%     Examination of the left upper extremity dressially neurovascularly intact.f the bed in no apparent dis  Labs:  Recent Labs    10/19/18 1000 10/20/18 0357  HGB 8.8* 7.1*   Recent Labs    10/19/18 1000 10/20/18 0357  WBC 10.9* 11.9*  RBC 3.77* 3.07*  HCT 30.2* 24.1*  PLT 401* 314   Recent Labs    10/19/18 1000 10/20/18 0357  NA 136 138  K 5.6* 3.5  CL 103 107  CO2 23 21*  BUN 25* 18  CREATININE 0.81 0.60  GLUCOSE 113* 119*  CALCIUM 8.8* 8.2*    Assessment and Plan:Postoperative day #1 status post reverse total shoulder arthroplasty for comminuted proximal humerus fracture #1.  Acute blood loss anemia on top of chronic Anemia: She normally lives around a hemoglobin of 10.  She initially came down because of the fracture and now a bit further because of the surgery.  She is not currently significantly symptomatic, however she notes and her daughter notes that in the past at this level she actually had to be rehospitalized because of mental status changes.  After discussion of risks benefits and alternatives she elected to have one unit packed red blood cell transfusion.  I think she should be okay to go home after the transfusion and she is eager to get home.  She will follow-up with me in 2 weeks for recheck.  Sooner with any problems or concerns.

## 2018-10-20 NOTE — Care Management Important Message (Signed)
Important Message  Patient Details IM Letter given to Marcelle Smiling RN to present to the Patient Name: Denise Ortega MRN: [id number] Date of Birth: 1945-06-24   Medicare Important Message Given:  Yes    Caren Macadam 10/20/2018, 12:04 PM

## 2018-10-21 DIAGNOSIS — E876 Hypokalemia: Secondary | ICD-10-CM

## 2018-10-21 LAB — BASIC METABOLIC PANEL
Anion gap: 10 (ref 5–15)
BUN: 19 mg/dL (ref 8–23)
CO2: 23 mmol/L (ref 22–32)
Calcium: 8.5 mg/dL — ABNORMAL LOW (ref 8.9–10.3)
Chloride: 105 mmol/L (ref 98–111)
Creatinine, Ser: 0.61 mg/dL (ref 0.44–1.00)
GFR calc Af Amer: >60 mL/min (ref >60)
GFR calc non Af Amer: >60 mL/min (ref >60)
Glucose, Bld: 109 mg/dL — ABNORMAL HIGH (ref 70–99)
Potassium: 3.4 mmol/L — ABNORMAL LOW (ref 3.5–5.1)
Sodium: 138 mmol/L (ref 135–145)

## 2018-10-21 LAB — CBC
HCT: 28.2 % — ABNORMAL LOW (ref 36.0–46.0)
Hemoglobin: 8.7 g/dL — ABNORMAL LOW (ref 12.0–15.0)
MCH: 25.1 pg — ABNORMAL LOW (ref 26.0–34.0)
MCHC: 30.9 g/dL (ref 30.0–36.0)
MCV: 81.3 fL (ref 80.0–100.0)
Platelets: 313 10*3/uL (ref 150–400)
RBC: 3.47 MIL/uL — ABNORMAL LOW (ref 3.87–5.11)
RDW: 16.3 % — ABNORMAL HIGH (ref 11.5–15.5)
WBC: 11.8 10*3/uL — ABNORMAL HIGH (ref 4.0–10.5)
nRBC: 0 % (ref 0.0–0.2)

## 2018-10-21 LAB — MAGNESIUM: Magnesium: 2 mg/dL (ref 1.7–2.4)

## 2018-10-21 MED ORDER — AMLODIPINE BESYLATE 10 MG PO TABS: 10.0000 mg | ORAL_TABLET | Freq: Every day | ORAL | 0 refills | Status: DC

## 2018-10-21 MED ORDER — AMLODIPINE BESYLATE 10 MG PO TABS: 10.0000 mg | ORAL_TABLET | Freq: Every day | ORAL | 0 refills | Status: AC

## 2018-10-21 MED ORDER — POTASSIUM CHLORIDE CRYS ER 20 MEQ PO TBCR
40.0000 meq | EXTENDED_RELEASE_TABLET | Freq: Once | ORAL | Status: AC
  Administered 2018-10-21: 40 meq via ORAL
  Filled 2018-10-21: qty 2

## 2018-10-21 NOTE — Progress Notes (Signed)
Pt stable with no needs at time of d/c. No need at time of d/c. Rn answered questions about d/c instructions and education.

## 2018-10-21 NOTE — Progress Notes (Signed)
PROGRESS NOTE    Denise Ortega  ZOX:[id number] DOB: Jul 02, 1945 DOA: 10/19/2018 PCP: System, Pcp Not In    Brief Narrative:  Denise Ortega is an 73 y.o. female with history of hypertension, right foot fracture, iron deficiency anemia, prior history of asthma, who was admitted to the orthopedic service after recent commuted left proximal humeral fracture with multiple head splitting fragments after a fall.  Patient was admitted on 10/19/2018 for operative treatment of left commuted proximal humeral fracture and underwent reverse shoulder arthroplasty on 10/19/2018.  Patient tolerated procedure well.  Patient noted to be anemic postoperatively with a hemoglobin dropping as low as 7.1.  Patient was transfused a unit of packed red blood cells and was subsequently to be discharged home when patient noted to have blood pressure of 200/80.  Blood pressure was repeated manually per RN and noted to be 202/78.  Discharge was canceled and the hospitalist service was called for consultation on patient's hypertensive urgency. Patient denies any blurry vision, no headaches, no chest pain, no shortness of breath, no fever, no chills, no nausea, no vomiting, no abdominal pain, no melena, no hematemesis, no hematochezia, no diarrhea, no constipation, no lightheadedness, no asymmetric weakness or numbness, no dysuria.  No complaints.   Assessment & Plan:   Principal Problem:   S/P reverse total shoulder arthroplasty, left Active Problems:   Hypertensive urgency   Osteoarthritis of right knee   Acute blood loss anemia   Status post total shoulder arthroplasty   Hypokalemia  1 hypertensive urgency Patient noted to have systolic blood pressures in the 200s just prior to discharge post transfusion of a unit of packed red blood cells.  Patient asymptomatic. Patient noted to be on Maxide prior to admission and stated she was supposed to be on another antihypertensive medication however cannot remember the name and has  not been on it for several weeks.  Patient states systolic blood pressures usually runs in the 150s to the 160s.  Patient was maintained on home regimen of Maxide and Norvasc 10 mg added to patient's blood pressure regimen.  Patient was also placed on IV hydralazine as needed.  Blood pressure improved such that by day of discharge blood pressure was down to 140/70.  Recommended blood pressure medications on discharge will be to continue home regimen of Maxide in addition to Norvasc 10 mg daily.  Patient is to follow-up with PCP in 1 to 2 weeks for further blood pressure management.   2.  Acute blood loss anemia/iron deficiency anemia Status post 1 unit packed red blood cells.  Hemoglobin stable at 8.7.   3.  Hypokalemia Replete.  Magnesium at 2.0.  4.  Comminuted left proximal humerus fracture status post reverse total shoulder arthroplasty, left Per primary team.  Patient stable from a medicine standpoint for discharge home.   DVT prophylaxis: Per primary team. Code Status: Full Family Communication: Updated patient. Disposition Plan: Patient stable to be discharged home from a medicine standpoint.  Final disposition plans per primary team.   Consultants:   [facility] hospitalist: Dr. Janee Morn 10/20/2018  Procedures:   Plain films of the left shoulder 10/19/2018  Left reverse shoulder arthroplasty per Dr. Ave Filter 10/19/2018  Antimicrobials:   None   Subjective: Sitting up in chair.  Dressed.  Appreciative of better control of her blood pressure.  Denies any chest pain, no shortness of breath, no headache.  Objective: Vitals:   10/21/18 0213 10/21/18 0533 10/21/18 0937 10/21/18 1029  BP: (!) 152/55 (!) 146/64 (!) 171/78  140/70  Pulse: (!) 56 63 65   Resp: 18 18 16    Temp: 97.8 F (36.6 C) 97.6 F (36.4 C) 97.8 F (36.6 C)   TempSrc: Oral Oral Oral   SpO2: 98% 96% 100%   Weight:      Height:        Intake/Output Summary (Last 24 hours) at 10/21/2018 1140 Last data  filed at 10/21/2018 0933 Gross per 24 hour  Intake 1524.37 ml  Output 1600 ml  Net -75.63 ml   Filed Weights   10/19/18 0945  Weight: 77.1 kg    Examination:  General exam: Appears calm and comfortable  Respiratory system: Clear to auscultation. Respiratory effort normal. Cardiovascular system: S1 & S2 heard, RRR. No JVD, murmurs, rubs, gallops or clicks. No pedal edema. Gastrointestinal system: Abdomen is nondistended, soft and nontender. No organomegaly or masses felt. Normal bowel sounds heard. Central nervous system: Alert and oriented. No focal neurological deficits. Extremities: Left upper extremity in sling.  Right lower extremity in cam boot.  Skin: No rashes, lesions or ulcers Psychiatry: Judgement and insight appear normal. Mood & affect appropriate.     Data Reviewed: I have personally reviewed following labs and imaging studies  CBC: Recent Labs  Lab 10/19/18 1000 10/20/18 0357 10/21/18 0347  WBC 10.9* 11.9* 11.8*  HGB 8.8* 7.1* 8.7*  HCT 30.2* 24.1* 28.2*  MCV 80.1 78.5* 81.3  PLT 401* 314 313   Basic Metabolic Panel: Recent Labs  Lab 10/19/18 1000 10/20/18 0357 10/21/18 0347  NA 136 138 138  K 5.6* 3.5 3.4*  CL 103 107 105  CO2 23 21* 23  GLUCOSE 113* 119* 109*  BUN 25* 18 19  CREATININE 0.81 0.60 0.61  CALCIUM 8.8* 8.2* 8.5*  MG  --   --  2.0   GFR: Estimated Creatinine Clearance: 62.5 mL/min (by C-G formula based on SCr of 0.61 mg/dL). Liver Function Tests: No results for input(s): AST, ALT, ALKPHOS, BILITOT, PROT, ALBUMIN in the last 168 hours. No results for input(s): LIPASE, AMYLASE in the last 168 hours. No results for input(s): AMMONIA in the last 168 hours. Coagulation Profile: No results for input(s): INR, PROTIME in the last 168 hours. Cardiac Enzymes: No results for input(s): CKTOTAL, CKMB, CKMBINDEX, TROPONINI in the last 168 hours. BNP (last 3 results) No results for input(s): PROBNP in the last 8760 hours. HbA1C: No results  for input(s): HGBA1C in the last 72 hours. CBG: No results for input(s): GLUCAP in the last 168 hours. Lipid Profile: No results for input(s): CHOL, HDL, LDLCALC, TRIG, CHOLHDL, LDLDIRECT in the last 72 hours. Thyroid Function Tests: No results for input(s): TSH, T4TOTAL, FREET4, T3FREE, THYROIDAB in the last 72 hours. Anemia Panel: No results for input(s): VITAMINB12, FOLATE, FERRITIN, TIBC, IRON, RETICCTPCT in the last 72 hours. Sepsis Labs: No results for input(s): PROCALCITON, LATICACIDVEN in the last 168 hours.  Recent Results (from the past 240 hour(s))  SARS Coronavirus 2 (CEPHEID - Performed in [facility] hospital lab), Hosp Order     Status: None   Collection Time: 10/19/18  7:46 AM  Result Value Ref Range Status   SARS Coronavirus 2 NEGATIVE NEGATIVE Final    Comment: (NOTE) If result is NEGATIVE SARS-CoV-2 target nucleic acids are NOT DETECTED. The SARS-CoV-2 RNA is generally detectable in upper and lower  respiratory specimens during the acute phase of infection. The lowest  concentration of SARS-CoV-2 viral copies this assay can detect is 250  copies / mL. A negative result  does not preclude SARS-CoV-2 infection  and should not be used as the sole basis for treatment or other  patient management decisions.  A negative result may occur with  improper specimen collection / handling, submission of specimen other  than nasopharyngeal swab, presence of viral mutation(s) within the  areas targeted by this assay, and inadequate number of viral copies  (<250 copies / mL). A negative result must be combined with clinical  observations, patient history, and epidemiological information. If result is POSITIVE SARS-CoV-2 target nucleic acids are DETECTED. The SARS-CoV-2 RNA is generally detectable in upper and lower  respiratory specimens dur ing the acute phase of infection.  Positive  results are indicative of active infection with SARS-CoV-2.  Clinical  correlation with  patient history and other diagnostic information is  necessary to determine patient infection status.  Positive results do  not rule out bacterial infection or co-infection with other viruses. If result is PRESUMPTIVE POSTIVE SARS-CoV-2 nucleic acids MAY BE PRESENT.   A presumptive positive result was obtained on the submitted specimen  and confirmed on repeat testing.  While 2019 novel coronavirus  (SARS-CoV-2) nucleic acids may be present in the submitted sample  additional confirmatory testing may be necessary for epidemiological  and / or clinical management purposes  to differentiate between  SARS-CoV-2 and other Sarbecovirus currently known to infect humans.  If clinically indicated additional testing with an alternate test  methodology [phone] is advised. The SARS-CoV-2 RNA is generally  detectable in upper and lower respiratory sp ecimens during the acute  phase of infection. The expected result is Negative. Fact Sheet for Patients:  BoilerBrush.com.cyhttps://www.fda.gov/media/136312/download Fact Sheet for Healthcare Providers: https://pope.com/https://www.fda.gov/media/136313/download This test is not yet approved or cleared by the [place] FDA and has been authorized for detection and/or diagnosis of SARS-CoV-2 by FDA under an Emergency Use Authorization (EUA).  This EUA will remain in effect (meaning this test can be used) for the duration of the COVID-19 declaration under Section 564(b)(1) of the Act, 21 U.S.C. section 360bbb-3(b)(1), unless the authorization is terminated or revoked sooner. Performed at [facility], [address] [place], [place] [address]          Radiology Studies: Dg Shoulder Left Port  Result Date: 10/19/2018 CLINICAL DATA:  The patient suffered a fall with a comminuted proximal left humerus fracture. Status post left shoulder replacement. Initial encounter. EXAM: LEFT SHOULDER - 1 VIEW COMPARISON:  None. FINDINGS: Reverse shoulder arthroplasty is  in place. The device is located. No acute bony abnormality is seen. IMPRESSION: Status post left shoulder replacement.  No acute finding. Electronically Signed   By: Drusilla Kannerhomas  Dalessio M.D.   On: 10/19/2018 15:09        Scheduled Meds: . amLODipine  10 mg Oral Daily  . aspirin EC  325 mg Oral BID  . docusate sodium  100 mg Oral BID  . triamterene-hydrochlorothiazide  1 tablet Oral Daily   Continuous Infusions: . methocarbamol (ROBAXIN) IV       LOS: 2 days    Time spent: 35 minutes    Ramiro Harvestaniel Thompson, MD [facility]  If 7PM-7AM, please contact night-coverage [email or web address] 10/21/2018, 11:40 AM

## 2018-10-21 NOTE — Plan of Care (Signed)
Pt to d/c home today with family once everything is arranged by nursing staff and md. Pt remains stable overall and being medicated for needs.

## 2018-10-22 LAB — TYPE AND SCREEN
ABO/RH(D): A POS
Antibody Screen: NEGATIVE
Unit division: 0

## 2018-10-22 LAB — URINE CULTURE: Culture: NO GROWTH

## 2018-10-22 LAB — BPAM RBC
Blood Product Expiration Date: 202006112359
ISSUE DATE / TIME: 202005291245
Unit Type and Rh: 6200

## 2018-11-01 ENCOUNTER — Ambulatory Visit
Admission: RE | Admit: 2018-11-01 | Discharge: 2018-11-01 | Disposition: A | Discharge status: Home / Self Care | Payer: Medicare Other | Source: Ambulatory Visit | Attending: Orthopaedic Surgery | Admitting: Orthopaedic Surgery

## 2018-11-01 ENCOUNTER — Other Ambulatory Visit: Payer: Self-pay

## 2018-11-01 ENCOUNTER — Other Ambulatory Visit: Payer: Self-pay | Admitting: Orthopaedic Surgery

## 2018-11-01 DIAGNOSIS — M79671 Pain in right foot: Secondary | ICD-10-CM

## 2018-11-03 ENCOUNTER — Other Ambulatory Visit: Payer: Self-pay | Admitting: Orthopaedic Surgery

## 2018-11-06 ENCOUNTER — Other Ambulatory Visit: Payer: Self-pay

## 2018-11-06 ENCOUNTER — Encounter (HOSPITAL_COMMUNITY): Payer: Self-pay | Admitting: *Deleted

## 2018-11-06 NOTE — Progress Notes (Addendum)
Mrs Raider denies chest pain or shortness of breath.  Patient denies that she nor her family has experienced any of the following: Cough Fever >100.4 Runny Nose Sore Throat Difficulty breathing/ shortness of breath Travel in past 14 days- no Patient will have COVID test at 1200 Tuesday.  I instructed patiet she is not to eat after midnmight, but she may drink clear liquids until 1:00PM  We went over clear liquids and patient voiced understanding: Carbonated Beverages, Water, Clear Tea, Black Coffee only, Juice (non-citric and without pulp), Gatorade, Plain Jell-O  only, Plain Popsicles .  PCO is Dr Cathlyn Parsons in [place], [place].  I spoke with Dr Lucia Gaskins and was given order for NPO after light breakfast before 10:30, then clear liquids until 1100. I called patient and informed her and we discussed what a light breakfast consist of- no greasy foods,dry  toast, yogurt, egg not fried in butter or oil.

## 2018-11-07 ENCOUNTER — Ambulatory Visit (HOSPITAL_COMMUNITY): Payer: Medicare Other

## 2018-11-07 ENCOUNTER — Encounter (HOSPITAL_COMMUNITY)
Admission: RE | Disposition: A | Discharge status: Home / Self Care | Payer: Self-pay | Source: Home / Self Care | Attending: Orthopaedic Surgery

## 2018-11-07 ENCOUNTER — Ambulatory Visit (HOSPITAL_COMMUNITY): Payer: Medicare Other | Admitting: Anesthesiology

## 2018-11-07 ENCOUNTER — Encounter (HOSPITAL_COMMUNITY): Payer: Self-pay | Admitting: Anesthesiology

## 2018-11-07 ENCOUNTER — Other Ambulatory Visit: Payer: Self-pay

## 2018-11-07 ENCOUNTER — Other Ambulatory Visit (HOSPITAL_COMMUNITY)
Admission: RE | Admit: 2018-11-07 | Discharge: 2018-11-07 | Disposition: A | Discharge status: Home / Self Care | Payer: Medicare Other | Source: Ambulatory Visit | Attending: Orthopaedic Surgery | Admitting: Orthopaedic Surgery

## 2018-11-07 ENCOUNTER — Ambulatory Visit (HOSPITAL_COMMUNITY)
Admission: RE | Admit: 2018-11-07 | Discharge: 2018-11-07 | Disposition: A | Discharge status: Home / Self Care | Payer: Medicare Other | Attending: Orthopaedic Surgery | Admitting: Orthopaedic Surgery

## 2018-11-07 DIAGNOSIS — Z7982 Long term (current) use of aspirin: Secondary | ICD-10-CM | POA: Insufficient documentation

## 2018-11-07 DIAGNOSIS — S92311A Displaced fracture of first metatarsal bone, right foot, initial encounter for closed fracture: Secondary | ICD-10-CM | POA: Diagnosis not present

## 2018-11-07 DIAGNOSIS — M1990 Unspecified osteoarthritis, unspecified site: Secondary | ICD-10-CM | POA: Insufficient documentation

## 2018-11-07 DIAGNOSIS — Z419 Encounter for procedure for purposes other than remedying health state, unspecified: Secondary | ICD-10-CM

## 2018-11-07 DIAGNOSIS — Z90710 Acquired absence of both cervix and uterus: Secondary | ICD-10-CM | POA: Diagnosis not present

## 2018-11-07 DIAGNOSIS — I10 Essential (primary) hypertension: Secondary | ICD-10-CM | POA: Insufficient documentation

## 2018-11-07 DIAGNOSIS — Z79899 Other long term (current) drug therapy: Secondary | ICD-10-CM | POA: Insufficient documentation

## 2018-11-07 DIAGNOSIS — Z1159 Encounter for screening for other viral diseases: Secondary | ICD-10-CM | POA: Diagnosis not present

## 2018-11-07 DIAGNOSIS — S92231A Displaced fracture of intermediate cuneiform of right foot, initial encounter for closed fracture: Secondary | ICD-10-CM | POA: Diagnosis not present

## 2018-11-07 DIAGNOSIS — Z87891 Personal history of nicotine dependence: Secondary | ICD-10-CM | POA: Insufficient documentation

## 2018-11-07 DIAGNOSIS — Z96651 Presence of right artificial knee joint: Secondary | ICD-10-CM | POA: Diagnosis not present

## 2018-11-07 DIAGNOSIS — J45909 Unspecified asthma, uncomplicated: Secondary | ICD-10-CM | POA: Insufficient documentation

## 2018-11-07 DIAGNOSIS — W19XXXA Unspecified fall, initial encounter: Secondary | ICD-10-CM | POA: Insufficient documentation

## 2018-11-07 DIAGNOSIS — S93324A Dislocation of tarsometatarsal joint of right foot, initial encounter: Secondary | ICD-10-CM | POA: Diagnosis present

## 2018-11-07 DIAGNOSIS — Z888 Allergy status to other drugs, medicaments and biological substances status: Secondary | ICD-10-CM | POA: Diagnosis not present

## 2018-11-07 HISTORY — PX: ORIF TOE FRACTURE: SHX5032

## 2018-11-07 HISTORY — DX: Personal history of other medical treatment: Z92.89

## 2018-11-07 LAB — POCT I-STAT 4, (NA,K, GLUC, HGB,HCT)
Glucose, Bld: 96 mg/dL (ref 70–99)
HCT: 35 % — ABNORMAL LOW (ref 36.0–46.0)
Hemoglobin: 11.9 g/dL — ABNORMAL LOW (ref 12.0–15.0)
Potassium: 3.5 mmol/L (ref 3.5–5.1)
Sodium: 140 mmol/L (ref 135–145)

## 2018-11-07 LAB — SARS CORONAVIRUS 2 BY RT PCR (HOSPITAL ORDER, PERFORMED IN ~~LOC~~ HOSPITAL LAB): SARS Coronavirus 2: NEGATIVE

## 2018-11-07 SURGERY — OPEN REDUCTION INTERNAL FIXATION (ORIF) METATARSAL (TOE) FRACTURE
Anesthesia: General | Site: Foot | Laterality: Right

## 2018-11-07 MED ORDER — ONDANSETRON HCL 4 MG/2ML IJ SOLN
INTRAMUSCULAR | Status: DC | PRN
  Administered 2018-11-07: 4 mg via INTRAVENOUS

## 2018-11-07 MED ORDER — DEXAMETHASONE SODIUM PHOSPHATE 10 MG/ML IJ SOLN
INTRAMUSCULAR | Status: DC | PRN
  Administered 2018-11-07: 10 mg via INTRAVENOUS

## 2018-11-07 MED ORDER — ACETAMINOPHEN 10 MG/ML IV SOLN: 1000.0000 mg | Freq: Once | INTRAVENOUS | Status: DC | PRN

## 2018-11-07 MED ORDER — MIDAZOLAM HCL 2 MG/2ML IJ SOLN
INTRAMUSCULAR | Status: AC
  Filled 2018-11-07: qty 2

## 2018-11-07 MED ORDER — HYDROCODONE-ACETAMINOPHEN 5-325 MG PO TABS
1.0000 | ORAL_TABLET | Freq: Once | ORAL | Status: AC
  Administered 2018-11-07: 19:00:00 1 via ORAL

## 2018-11-07 MED ORDER — LACTATED RINGERS IV SOLN: INTRAVENOUS | Status: DC

## 2018-11-07 MED ORDER — ROCURONIUM BROMIDE 100 MG/10ML IV SOLN
INTRAVENOUS | Status: DC | PRN
  Administered 2018-11-07: 40 mg via INTRAVENOUS

## 2018-11-07 MED ORDER — HYDROCODONE-ACETAMINOPHEN 5-325 MG PO TABS
ORAL_TABLET | ORAL | Status: AC
  Filled 2018-11-07: qty 1

## 2018-11-07 MED ORDER — FENTANYL CITRATE (PF) 250 MCG/5ML IJ SOLN
INTRAMUSCULAR | Status: DC | PRN
  Administered 2018-11-07 (×3): 50 ug via INTRAVENOUS
  Administered 2018-11-07: 100 ug via INTRAVENOUS

## 2018-11-07 MED ORDER — SUGAMMADEX SODIUM 200 MG/2ML IV SOLN
INTRAVENOUS | Status: DC | PRN
  Administered 2018-11-07: 200 mg via INTRAVENOUS

## 2018-11-07 MED ORDER — POVIDONE-IODINE 10 % EX SWAB: 2.0000 "application " | Freq: Once | CUTANEOUS | Status: DC

## 2018-11-07 MED ORDER — ONDANSETRON HCL 4 MG/2ML IJ SOLN
INTRAMUSCULAR | Status: AC
  Filled 2018-11-07: qty 2

## 2018-11-07 MED ORDER — MEPERIDINE HCL 25 MG/ML IJ SOLN: 6.2500 mg | INTRAMUSCULAR | Status: DC | PRN

## 2018-11-07 MED ORDER — 0.9 % SODIUM CHLORIDE (POUR BTL) OPTIME
TOPICAL | Status: DC | PRN
  Administered 2018-11-07: 1000 mL

## 2018-11-07 MED ORDER — HYDROCODONE-ACETAMINOPHEN 5-325 MG PO TABS: 1.0000 | ORAL_TABLET | ORAL | 0 refills | Status: AC | PRN

## 2018-11-07 MED ORDER — FENTANYL CITRATE (PF) 100 MCG/2ML IJ SOLN
25.0000 ug | INTRAMUSCULAR | Status: DC | PRN
  Administered 2018-11-07: 25 ug via INTRAVENOUS

## 2018-11-07 MED ORDER — ACETAMINOPHEN 160 MG/5ML PO SOLN: 325.0000 mg | Freq: Once | ORAL | Status: DC | PRN

## 2018-11-07 MED ORDER — ACETAMINOPHEN 325 MG PO TABS: 325.0000 mg | ORAL_TABLET | Freq: Once | ORAL | Status: DC | PRN

## 2018-11-07 MED ORDER — DEXAMETHASONE SODIUM PHOSPHATE 10 MG/ML IJ SOLN
INTRAMUSCULAR | Status: AC
  Filled 2018-11-07: qty 1

## 2018-11-07 MED ORDER — CEFAZOLIN SODIUM-DEXTROSE 2-4 GM/100ML-% IV SOLN
2.0000 g | INTRAVENOUS | Status: AC
  Administered 2018-11-07: 2 g via INTRAVENOUS
  Filled 2018-11-07: qty 100

## 2018-11-07 MED ORDER — FENTANYL CITRATE (PF) 250 MCG/5ML IJ SOLN
INTRAMUSCULAR | Status: AC
  Filled 2018-11-07: qty 5

## 2018-11-07 MED ORDER — PROMETHAZINE HCL 25 MG/ML IJ SOLN: 6.2500 mg | INTRAMUSCULAR | Status: DC | PRN

## 2018-11-07 MED ORDER — FENTANYL CITRATE (PF) 100 MCG/2ML IJ SOLN
INTRAMUSCULAR | Status: AC
  Filled 2018-11-07: qty 2

## 2018-11-07 MED ORDER — BUPIVACAINE HCL 0.25 % IJ SOLN
INTRAMUSCULAR | Status: DC | PRN
  Administered 2018-11-07: 30 mL

## 2018-11-07 MED ORDER — LACTATED RINGERS IV SOLN
INTRAVENOUS | Status: DC
  Administered 2018-11-07 (×2): via INTRAVENOUS

## 2018-11-07 MED ORDER — MIDAZOLAM HCL 2 MG/2ML IJ SOLN
INTRAMUSCULAR | Status: DC | PRN
  Administered 2018-11-07: 2 mg via INTRAVENOUS

## 2018-11-07 SURGICAL SUPPLY — 50 items
BANDAGE ACE 4X5 VEL STRL LF (GAUZE/BANDAGES/DRESSINGS) ×4 IMPLANT
BANDAGE ACE 6X5 VEL STRL LF (GAUZE/BANDAGES/DRESSINGS) IMPLANT
BENZOIN TINCTURE PRP APPL 2/3 (GAUZE/BANDAGES/DRESSINGS) IMPLANT
BIT DRILL CANN F/COMP 2.2 (BIT) ×2 IMPLANT
BNDG COHESIVE 4X5 TAN STRL (GAUZE/BANDAGES/DRESSINGS) IMPLANT
BNDG ESMARK 4X9 LF (GAUZE/BANDAGES/DRESSINGS) ×3 IMPLANT
CHLORAPREP W/TINT 26 (MISCELLANEOUS) ×3 IMPLANT
CLOSURE WOUND 1/2 X4 (GAUZE/BANDAGES/DRESSINGS)
COVER WAND RF STERILE (DRAPES) IMPLANT
CUFF TOURN SGL QUICK 34 (TOURNIQUET CUFF) ×2
CUFF TRNQT CYL 34X4.125X (TOURNIQUET CUFF) IMPLANT
DRAPE C-ARMOR (DRAPES) ×2 IMPLANT
DRAPE IMP U-DRAPE 54X76 (DRAPES) ×3 IMPLANT
DRAPE U-SHAPE 47X51 STRL (DRAPES) ×3 IMPLANT
ELECT REM PT RETURN 9FT ADLT (ELECTROSURGICAL) ×3
ELECTRODE REM PT RTRN 9FT ADLT (ELECTROSURGICAL) ×1 IMPLANT
GAUZE SPONGE 4X4 12PLY STRL (GAUZE/BANDAGES/DRESSINGS) ×3 IMPLANT
GAUZE XEROFORM 1X8 LF (GAUZE/BANDAGES/DRESSINGS) ×3 IMPLANT
GAUZE XEROFORM 5X9 LF (GAUZE/BANDAGES/DRESSINGS) ×2 IMPLANT
GLOVE BIO SURGEON STRL SZ7.5 (GLOVE) ×3 IMPLANT
GLOVE BIOGEL PI IND STRL 8 (GLOVE) ×1 IMPLANT
GLOVE BIOGEL PI INDICATOR 8 (GLOVE) ×2
GOWN STRL REUS W/ TWL LRG LVL3 (GOWN DISPOSABLE) ×1 IMPLANT
GOWN STRL REUS W/ TWL XL LVL3 (GOWN DISPOSABLE) ×1 IMPLANT
GOWN STRL REUS W/TWL LRG LVL3 (GOWN DISPOSABLE) ×2
GOWN STRL REUS W/TWL XL LVL3 (GOWN DISPOSABLE) ×2
GUIDEWIRE .045IN 1.14MM (WIRE) ×4 IMPLANT
K-WIRE BB-TAK (WIRE) ×3
KIT BASIN OR (CUSTOM PROCEDURE TRAY) ×3 IMPLANT
KWIRE BB-TAK (WIRE) IMPLANT
NS IRRIG 1000ML POUR BTL (IV SOLUTION) ×3 IMPLANT
PACK ORTHO EXTREMITY (CUSTOM PROCEDURE TRAY) ×6 IMPLANT
PAD CAST 4YDX4 CTTN HI CHSV (CAST SUPPLIES) ×1 IMPLANT
PADDING CAST COTTON 4X4 STRL (CAST SUPPLIES) ×2
PADDING CAST SYNTHETIC 4 (CAST SUPPLIES) ×2
PADDING CAST SYNTHETIC 4X4 STR (CAST SUPPLIES) ×1 IMPLANT
PLATE MTP L SHORT (Plate) ×2 IMPLANT
SCREW LP CORT 3.0X24 (Screw) ×4 IMPLANT
SCREW LP CORT 3.0X28MM (Screw) ×4 IMPLANT
SCREW LP CORT 3.0X30MM (Screw) ×4 IMPLANT
SCREW LP CORT 3.0X32MM (Screw) ×2 IMPLANT
SPONGE LAP 18X18 RF (DISPOSABLE) IMPLANT
STRIP CLOSURE SKIN 1/2X4 (GAUZE/BANDAGES/DRESSINGS) IMPLANT
SUT ETHILON 3 0 PS 1 (SUTURE) ×3 IMPLANT
SUT MNCRL AB 3-0 PS2 18 (SUTURE) ×3 IMPLANT
SUT PDS AB 2-0 CT2 27 (SUTURE) ×3 IMPLANT
TOWEL GREEN STERILE FF (TOWEL DISPOSABLE) ×6 IMPLANT
TUBE CONNECTING 20'X1/4 (TUBING) ×1
TUBE CONNECTING 20X1/4 (TUBING) ×2 IMPLANT
UNDERPAD 30X30 (UNDERPADS AND DIAPERS) ×3 IMPLANT

## 2018-11-07 NOTE — Anesthesia Preprocedure Evaluation (Addendum)
Anesthesia Evaluation  Patient identified by MRN, date of birth, ID band Patient awake    Reviewed: Allergy & Precautions, NPO status , Patient's Chart, lab work & pertinent test results  Airway Mallampati: II  TM Distance: >3 FB Neck ROM: Full    Dental  (+) Dental Advisory Given, Partial Upper   Pulmonary asthma , former smoker,    breath sounds clear to auscultation       Cardiovascular hypertension,  Rhythm:Regular Rate:Normal     Neuro/Psych negative neurological ROS  negative psych ROS   GI/Hepatic negative GI ROS, Neg liver ROS,   Endo/Other  negative endocrine ROS  Renal/GU negative Renal ROS     Musculoskeletal  (+) Arthritis ,   Abdominal Normal abdominal exam  (+)   Peds  Hematology   Anesthesia Other Findings   Reproductive/Obstetrics                           Lab Results  Component Value Date   WBC 11.8 (H) 10/21/2018   HGB 8.7 (L) 10/21/2018   HCT 28.2 (L) 10/21/2018   MCV 81.3 10/21/2018   PLT 313 10/21/2018   Lab Results  Component Value Date   CREATININE 0.61 10/21/2018   BUN 19 10/21/2018   NA 138 10/21/2018   K 3.4 (L) 10/21/2018   CL 105 10/21/2018   CO2 23 10/21/2018   Lab Results  Component Value Date   INR 0.95 02/14/2018   EKG: normal sinus rhythm.  Anesthesia Physical Anesthesia Plan  ASA: II  Anesthesia Plan: General   Post-op Pain Management:    Induction: Intravenous, Rapid sequence and Cricoid pressure planned  PONV Risk Score and Plan: 4 or greater and Ondansetron, Dexamethasone, Midazolam and Scopolamine patch - Pre-op  Airway Management Planned: Oral ETT  Additional Equipment: None  Intra-op Plan:   Post-operative Plan: Extubation in OR  Informed Consent: I have reviewed the patients History and Physical, chart, labs and discussed the procedure including the risks, benefits and alternatives for the proposed anesthesia with the  patient or authorized representative who has indicated his/her understanding and acceptance.     Dental advisory given  Plan Discussed with: CRNA  Anesthesia Plan Comments:        Anesthesia Quick Evaluation

## 2018-11-07 NOTE — Op Note (Addendum)
Denise Ortega female 73 y.o. 11/07/2018  PreOperative Diagnosis: Right first metatarsal fracture Right first TMT dislocation  PostOperative Diagnosis: Same  PROCEDURE: Open reduction internal fixation right first metatarsal fracture Open reduction internal fixation right first TMT dislocation Open treatment of middle cuneiform fracture  SURGEON: Melony Overly, MD  ASSISTANT: None  ANESTHESIA: General with ankle block  FINDINGS: Right long oblique first metatarsal fracture with first TMT dislocation of the dorsal fragment  IMPLANTS: Arthrex 3.0 mm plate 3.0 millimeter screws  INDICATIONS:73 y.o. female had a fall several weeks ago and sustained a right first metatarsal fracture as well as a first TMT dislocation and left proximal humerus fracture requiring reverse arthroplasty.  She was told by initial treating surgeon to weight-bear in a boot and had displacement of her fracture with dorsal subluxation and dislocation of the dorsal fragment of the first TMT joint.  CT scan was obtained after coming to my office and given the amount of subluxation and displacement of the fracture was indicated for open treatment.  We discussed the risk, benefits alternatives of surgery which included but were not limited to wound healing complications, infection, need for further surgery, damage surrounding structures, malunion, nonunion, continued pain or development of arthritis given the intra-articular nature of the fracture.  After weighing these risks he opted proceed.  We also discussed perioperative and anesthetic risk as well as the weightbearing restrictions and she understood and agreed to comply and proceed.  PROCEDURE: Patient was identified in the preoperative holding area.  The right foot was marked by myself.  Consent was signed myself and the patient.  She is taken the operative suite placed supine the operative table.  General anesthesia was induced out difficulty.  Preoperative  antibiotics were given.  A bump was placed under the right hip and a right tourniquet a right thigh tourniquet was placed.  All bony prominences were well-padded.  Bone foam was used.  The right lower extremity was prepped and draped in the usual sterile fashion.  Timeout was performed.  The leg was elevated and the tourniquet was inflated to 250 mmHg.  We began by making a longitudinal incision between the first metatarsal and second metatarsal at the level of the first TMT joint.  This taken sharply down through skin and subcutaneous tissue.  The EHL tendon was identified and mobilized medially.  Under that the extensor hallucis brevis muscle belly and tendon was identified and mobilized.  The neurovascular bundle was identified and intact.  It was mobilized and retracted and protected through the entirety of the case.  Then the first TMT joint was identified and there is dorsal subluxation of the dorsal fragment of the first metatarsal fracture.  An incision was made along the dorsal capsular tissue of the metatarsal and the joint was entered.  It had been several weeks since her fracture and there is significant amount of callus formation about the fracture site and within the joint.  This was cleaned out with a rondure sharply with a 15 blade.  The dorsal fragment was completely dislocated from the joint and the plantar fragment was identified and the fracture site was long oblique in the sagittal plane and it was cleaned with a rondure.  The fracture was then reduced and held provisionally with a pointed reduction forcep.  The joint surface was inspected through direct visualization found to be acceptable.  There was a small area of depressed cartilage centrally that was removed as it was a small cartilaginous piece only.  Then 2  3 millimeter screws were placed across the fracture site and extra-articular fashion to hold and fix the fracture.  Then there was continued gross instability at the first TMT joint.  The cuneiform fracture dorsally was inspected and small fragments were removed to allow for appropriate plate placement. Given this it was decided to bridge plate across this for stabilization.  A plate was chosen and and placed across the first TMT joint dorsally.  Good fixation was obtained.  Reduction was confirmed through direct visualization as well as through fluoroscopic guidance.  Screw length was confirmed to be appropriate.  Then we inspected the Lisfranc joint which was stable and without evidence of gross injury or instability.  The intercuneiform joint appeared stable as well.  Then the wound was irrigated copiously with normal saline.  We then released the tourniquet and hemostasis was obtained.  There was good perfusion to the foot postoperatively.  Then the deep extensor retinacular and fascial tissue was closed with a 2-0 PDS suture.  3-0 Monocryl was used for the subcuticular tissue with 3-0 nylon for the skin.  A soft dressing of Xeroform, 4 x 4's and sterile she cotton was placed.  She was placed in a short leg splint with the intention to remain nonweightbearing.  All counts were correct at the end the case there was no complications.  She was awakened from anesthesia and taken recovery in stable condition.  POST OPERATIVE INSTRUCTIONS: Nonweightbearing to right lower extremity Keep splint dry and limb elevated Call the office with concerns. She will follow-up in 2 weeks for splint removal, nonweightbearing x-rays and placement of a short leg nonweightbearing cast. She will remain nonweightbearing likely 8 to 10 weeks based on x-rays. She may require plate removal in 4 to 6 months.  TOURNIQUET TIME: 81 minutes  BLOOD LOSS:  Minimal         DRAINS: none         SPECIMEN: none       COMPLICATIONS:  * No complications entered in OR log *         Disposition: PACU - hemodynamically stable.         Condition: stable

## 2018-11-07 NOTE — Transfer of Care (Signed)
Immediate Anesthesia Transfer of Care Note  Patient: Denise Ortega  Procedure(s) Performed: OPEN REDUCTION INTERNAL RIGHT FIRST METATARSAL FRACTURE VERSUS FIRST TARSOMETATARSAL ARTHRODESIS (Right Foot)  Patient Location: PACU  Anesthesia Type:General  Level of Consciousness: awake  Airway & Oxygen Therapy: Patient Spontanous Breathing  Post-op Assessment: Report given to RN and Post -op Vital signs reviewed and stable  Post vital signs: Reviewed and stable  Last Vitals:  Vitals Value Taken Time  BP 143/64 11/07/18 1824  Temp 36.3 C 11/07/18 1824  Pulse 91 11/07/18 1829  Resp 19 11/07/18 1829  SpO2 96 % 11/07/18 1829  Vitals shown include unvalidated device data.  Last Pain:  Vitals:   11/07/18 1824  TempSrc:   PainSc: (P) Asleep         Complications: No apparent anesthesia complications

## 2018-11-07 NOTE — Discharge Instructions (Signed)
DR. [facility] POST-OP INSTRUCTIONS ° ° °Pain Management °1. The numbing medicine and your leg will last around 8 hours, take a dose of your pain medicine as soon as you feel it wearing off to avoid rebound pain. °2. Keep your foot elevated above heart level.  Make sure that your heel hangs free ('floats'). °3. Take all prescribed medication as directed. °4. If taking narcotic pain medication you may want to use an over-the-counter stool softener to avoid constipation. °5. You may take over-the-counter NSAIDs (ibuprofen, naproxen, etc.) as well as over-the-counter acetaminophen as directed on the packaging as a supplement for your pain and may also use it to wean away from the prescription medication. ° °Activity °? Non-weightbearing °? Postoperatively, you will be placed into a splint which stays on for 2 weeks and then will be changed at your first postop visit. ° °First Postoperative Visit °1. Your first postop visit will be at least 2 weeks after surgery.  This should be scheduled when you schedule surgery. °2. If you do not have a postoperative visit scheduled please call [phone] to schedule an appointment. °3. At the appointment your incision will be evaluated for suture removal, x-rays will be obtained if necessary. ° °General Instructions °1. Swelling is very common after foot and ankle surgery.  It often takes 3 months for the foot and ankle to begin to feel comfortable.  Some amount of swelling will persist for 6-12 months. °2. DO NOT change the dressing.  If there is a problem with the dressing (too tight, loose, gets wet, etc.) please contact Dr. Rahkeem Senft's office. °3. DO NOT get the dressing wet.  For showers you can use an over-the-counter cast cover or wrap a washcloth around the top of your dressing and then cover it with a plastic bag and tape it to your leg. °4. DO NOT soak the incision (no tubs, pools, bath, etc.) until you have approval from Dr. Emaleigh Guimond. ° °Contact Dr. Adairs  office or go to Emergency Room if: °1. Temperature above 101° F. °2. Increasing pain that is unresponsive to pain medication or elevation °3. Excessive redness or swelling in your foot °4. Dressing problems - excessive bloody drainage, looseness or tightness, or if dressing gets wet °5. Develop pain, swelling, warmth, or discoloration of your calf ° °

## 2018-11-07 NOTE — Anesthesia Postprocedure Evaluation (Signed)
Anesthesia Post Note  Patient: Denise Ortega  Procedure(s) Performed: OPEN REDUCTION INTERNAL RIGHT FIRST METATARSAL FRACTURE VERSUS FIRST TARSOMETATARSAL ARTHRODESIS (Right Foot)     Patient location during evaluation: PACU Anesthesia Type: General Level of consciousness: awake and alert Pain management: pain level controlled Vital Signs Assessment: post-procedure vital signs reviewed and stable Respiratory status: spontaneous breathing, nonlabored ventilation, respiratory function stable and patient connected to nasal cannula oxygen Cardiovascular status: blood pressure returned to baseline and stable Postop Assessment: no apparent nausea or vomiting Anesthetic complications: no    Last Vitals:  Vitals:   11/07/18 1839 11/07/18 1854  BP: (!) 167/85 (!) 156/72  Pulse: 93 86  Resp: 16 16  Temp:    SpO2: 93% 96%    Last Pain:  Vitals:   11/07/18 1854  TempSrc:   PainSc: 2         RLE Motor Response: Responds to commands;Purposeful movement (11/07/18 1854) RLE Sensation: Full sensation (11/07/18 1854)      Tarahji Ramthun S

## 2018-11-07 NOTE — Anesthesia Procedure Notes (Signed)
Procedure Name: Intubation Date/Time: 11/07/2018 4:18 PM Performed by: Purvis Kilts, CRNA Pre-anesthesia Checklist: Patient identified, Suction available, Emergency Drugs available, Patient being monitored and Timeout performed Patient Re-evaluated:Patient Re-evaluated prior to induction Oxygen Delivery Method: Circle system utilized Preoxygenation: Pre-oxygenation with 100% oxygen Induction Type: IV induction Ventilation: Mask ventilation without difficulty Laryngoscope Size: Mac and 3 Grade View: Grade I Tube type: Oral Tube size: 7.0 mm Number of attempts: 1 Airway Equipment and Method: Stylet Placement Confirmation: ETT inserted through vocal cords under direct vision,  positive ETCO2 and breath sounds checked- equal and bilateral Secured at: 21 cm Tube secured with: Tape Dental Injury: Teeth and Oropharynx as per pre-operative assessment

## 2018-11-07 NOTE — Progress Notes (Signed)
Dr. Smith Spike Desilets made aware of NPO status.

## 2018-11-07 NOTE — H&P (Signed)
Denise Ortega is an 73 y.o. female.   Chief Complaint: Right first metatarsal fracture with first TMT dislocation HPI: Patient is here today for surgical intervention of her first metatarsal fracture with first TMT dislocation.  Patient was initially seen by an outside provider where there was minimal displacement of her fracture.  She proceeded with weightbearing in a walking boot and had displacement and interval dislocation of her first TMT joint dorsally.  She was seen by me due to the nature of her injury.  CT scan revealed dorsal dislocation of her first metatarsal fragment.  She was indicated for surgery due to the amount of displacement and first TMT instability and dislocation.  She is here today for surgery.  She has been walking in a walking boot.  She denies any recent fevers or chills.  Of note she had a reverse total shoulder arthroplasty due to proximal humerus fracture at the same time of this initial injury.  That was on 10/19/2018.  Past Medical History:  Diagnosis Date  . Anemia   . Asthma   . Foot fracture, right   . History of blood transfusion   . Humerus fracture    left humerus  . Hypertension     Past Surgical History:  Procedure Laterality Date  . ABDOMINAL HYSTERECTOMY    . ANKLE FRACTURE SURGERY Right 1980  . COLONOSCOPY    . EYE SURGERY Bilateral    Cataract  . LASIK    . MENISCUS REPAIR Bilateral   . REVERSE SHOULDER ARTHROPLASTY Left 10/19/2018   Procedure: REVERSE SHOULDER ARTHROPLASTY;  Surgeon: Jones Broomhandler, Justin, MD;  Location: [facility];  Service: Orthopedics;  Laterality: Left;  . TOTAL KNEE ARTHROPLASTY Right 02/22/2018   Procedure: RIGHT TOTAL KNEE ARTHROPLASTY;  Surgeon: Gean Birchwoodowan, Frank, MD;  Location: [facility];  Service: Orthopedics;  Laterality: Right;  . WRIST ARTHROSCOPY Left     History reviewed. No pertinent family history. Social History:  reports that she has quit smoking. She has never used smokeless tobacco. She reports that she does not drink  alcohol or use drugs.  Allergies:  Allergies  Allergen Reactions  . Ativan [Lorazepam] Other (See Comments)    Mental disturbances, pt's mother-in-law Not patient.    Medications Prior to Admission  Medication Sig Dispense Refill  . acetaminophen (TYLENOL) 500 MG tablet Take 500 mg by mouth every 6 (six) hours as needed (pain).    Marland Kitchen. albuterol (PROVENTIL HFA;VENTOLIN HFA) 108 (90 Base) MCG/ACT inhaler Inhale 2 puffs into the lungs every 6 (six) hours as needed for wheezing or shortness of breath.     Marland Kitchen. amLODipine (NORVASC) 10 MG tablet Take 1 tablet (10 mg total) by mouth daily. 30 tablet 0  . amLODipine (NORVASC) 10 MG tablet Take 10 mg by mouth daily.    Marland Kitchen. aspirin EC 81 MG tablet Take 1 tablet (81 mg total) by mouth 2 (two) times daily. (Patient taking differently: Take 81 mg by mouth daily. ) 60 tablet 0  . Calcium Carb-Cholecalciferol (CALCIUM 600 + D PO) Take 2 tablets by mouth daily.    . COLLAGEN-VITAMIN C PO Take 1 tablet by mouth daily.    . ferrous sulfate 325 (65 FE) MG tablet Take 325 mg by mouth daily with breakfast.    . polyvinyl alcohol (LUBRICANT DROPS) 1.4 % ophthalmic solution Place 1-2 drops into both eyes as needed for dry eyes.    Marland Kitchen. triamterene-hydrochlorothiazide (MAXZIDE-25) 37.5-25 MG tablet Take 1 tablet by mouth daily.  2  . HYDROcodone-acetaminophen (  NORCO/VICODIN) 5-325 MG tablet Take 1-2 tablets by mouth every 4 (four) hours as needed for moderate pain. (Patient not taking: Reported on 11/03/2018) 30 tablet 0    Results for orders placed or performed during the hospital encounter of 11/07/18 (from the past 48 hour(s))  SARS Coronavirus 2 (CEPHEID - Performed in [facility] hospital lab), Hosp Order     Status: None   Collection Time: 11/07/18 11:55 AM   Specimen: Nasopharyngeal Swab  Result Value Ref Range   SARS Coronavirus 2 NEGATIVE NEGATIVE    Comment: (NOTE) If result is NEGATIVE SARS-CoV-2 target nucleic acids are NOT DETECTED. The SARS-CoV-2 RNA is  generally detectable in upper and lower  respiratory specimens during the acute phase of infection. The lowest  concentration of SARS-CoV-2 viral copies this assay can detect is 250  copies / mL. A negative result does not preclude SARS-CoV-2 infection  and should not be used as the sole basis for treatment or other  patient management decisions.  A negative result may occur with  improper specimen collection / handling, submission of specimen other  than nasopharyngeal swab, presence of viral mutation(s) within the  areas targeted by this assay, and inadequate number of viral copies  (<250 copies / mL). A negative result must be combined with clinical  observations, patient history, and epidemiological information. If result is POSITIVE SARS-CoV-2 target nucleic acids are DETECTED. The SARS-CoV-2 RNA is generally detectable in upper and lower  respiratory specimens dur ing the acute phase of infection.  Positive  results are indicative of active infection with SARS-CoV-2.  Clinical  correlation with patient history and other diagnostic information is  necessary to determine patient infection status.  Positive results do  not rule out bacterial infection or co-infection with other viruses. If result is PRESUMPTIVE POSTIVE SARS-CoV-2 nucleic acids MAY BE PRESENT.   A presumptive positive result was obtained on the submitted specimen  and confirmed on repeat testing.  While 2019 novel coronavirus  (SARS-CoV-2) nucleic acids may be present in the submitted sample  additional confirmatory testing may be necessary for epidemiological  and / or clinical management purposes  to differentiate between  SARS-CoV-2 and other Sarbecovirus currently known to infect humans.  If clinically indicated additional testing with an alternate test  methodology [phone]([id number]) is advised. The SARS-CoV-2 RNA is generally  detectable in upper and lower respiratory sp ecimens during the acute  phase of  infection. The expected result is Negative. Fact Sheet for Patients:  BoilerBrush.com.cyhttps://www.fda.gov/media/136312/download Fact Sheet for Healthcare Providers: https://pope.com/https://www.fda.gov/media/136313/download This test is not yet approved or cleared by the [place] FDA and has been authorized for detection and/or diagnosis of SARS-CoV-2 by FDA under an Emergency Use Authorization (EUA).  This EUA will remain in effect (meaning this test can be used) for the duration of the COVID-19 declaration under Section 564(b)(1) of the Act, 21 U.S.C. section 360bbb-3(b)(1), unless the authorization is terminated or revoked sooner. Performed at [facility], [address] [place], [place] [address]    No results found.  Review of Systems  Constitutional: Negative.   HENT: Negative.   Eyes: Negative.   Respiratory: Negative.   Cardiovascular: Negative.   Gastrointestinal: Negative.   Musculoskeletal:       Right foot pain  Skin: Negative.   Neurological: Negative.   Psychiatric/Behavioral: Negative.     Blood pressure (!) 180/63, pulse 90, temperature 98.2 F (36.8 C), temperature source Oral, resp. rate 20, height 5\' 3"  (1.6 m), weight 77.1  kg, SpO2 99 %. Physical Exam  Constitutional: She appears well-developed.  HENT:  Head: Normocephalic.  Eyes: Conjunctivae are normal.  Cardiovascular: Normal rate.  Respiratory: Effort normal.  GI: Soft.  Musculoskeletal:     Comments: Right foot demonstrates swelling.  She has tenderness palpation of the first TMT and along the first metatarsal.  She has no tenderness to palpation about the ankle medially, laterally or anteriorly.  She has active ankle dorsiflexion plantarflexion intact.  Sensation intact on dorsal and plantar foot.  Palpable dorsalis pedis pulse.  Neurological: She is alert.  Skin: Skin is warm.  Psychiatric: She has a normal mood and affect.     Assessment/Plan We will proceed with open treatment of her right first  metatarsal fracture and TMT dislocation.  We consented her for possible first TMT arthrodesis given the CT scan evidence of impaction of the joint surface.  Hopefully we will not need to perform an arthrodesis.  She understands the risk, benefits and alternatives of surgery which include wound healing complications, infection, nonunion, need for further surgery, continued pain, development of arthritis, instability.  We also discussed the possibility of needing further surgery if spanning plate fixation is used.  She understands the weightbearing restrictions and agrees to comply.  She also understands the perioperative and anesthetic risk which include death.  We will proceed with surgery.  Erle Crocker, MD 11/07/2018, 2:22 PM

## 2018-11-08 ENCOUNTER — Encounter (HOSPITAL_COMMUNITY): Payer: Self-pay | Admitting: Orthopaedic Surgery

## 2018-11-09 ENCOUNTER — Other Ambulatory Visit: Payer: Self-pay | Admitting: Orthopaedic Surgery

## 2018-11-09 DIAGNOSIS — M79671 Pain in right foot: Secondary | ICD-10-CM

## 2019-09-03 IMAGING — CT CT OF THE RIGHT FOOT WITHOUT CONTRAST
4 series · 13 of 27 positions shown, 15 images · non-contrast
Comparison: None.

CLINICAL DATA: Fell and injured foot 10/14/2018. Persistent pain
and swelling.

EXAM:
CT OF THE RIGHT FOOT WITHOUT CONTRAST
TECHNIQUE: Multidetector CT imaging of the right foot was performed according
to the standard protocol. Multiplanar CT image reconstructions were
also generated.

[Series 4: soft tissue lower extremity · axial · 0.52mm/px · z∈[-153,-103]mm · 2 of 76 slices shown]
[im 26/76  soft-tissue]
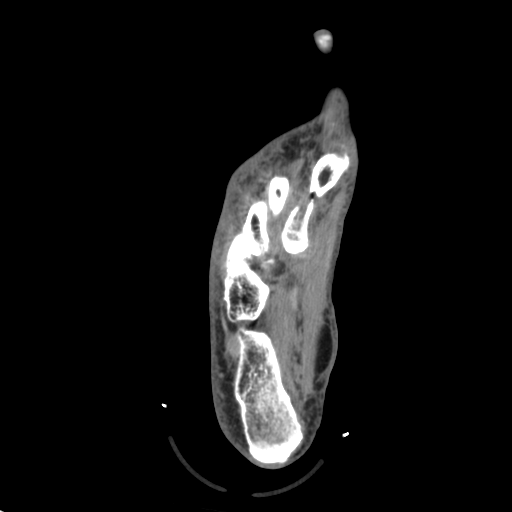
[im 51/76  soft-tissue]
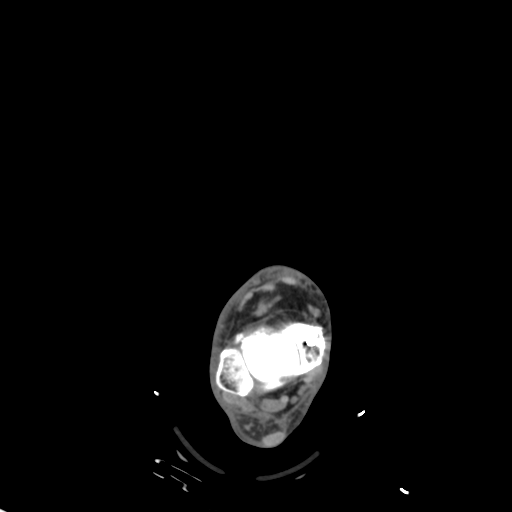

[Series 10: sagsoft tissue · sagittal · 0.29mm/px · 5 of 51 slices shown, 6 images]
[im 17/51  bone]
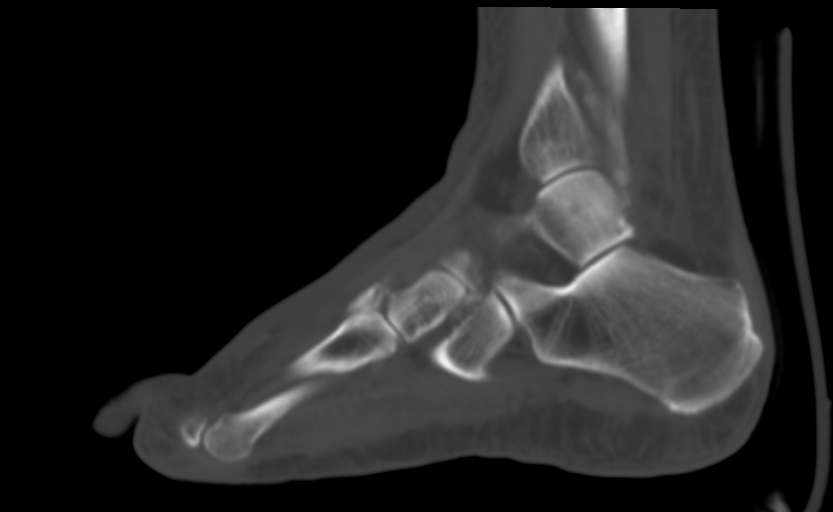
[im 21/51  bone]
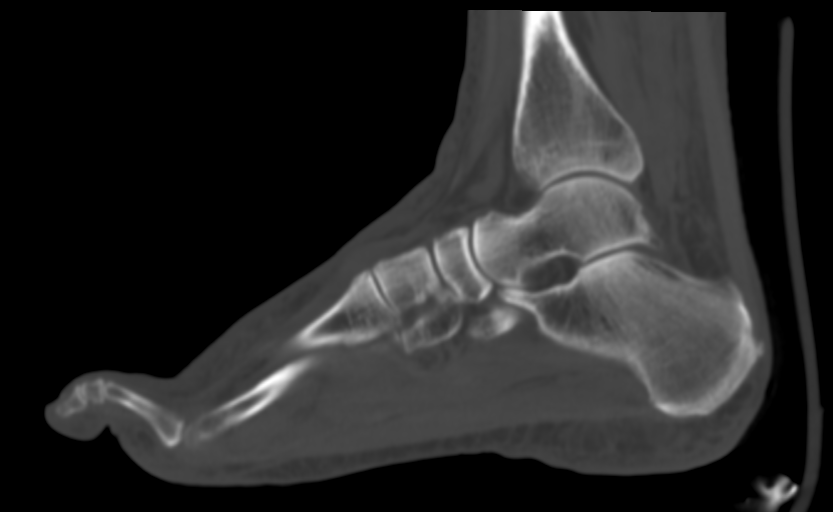
[im 26/51  soft-tissue]
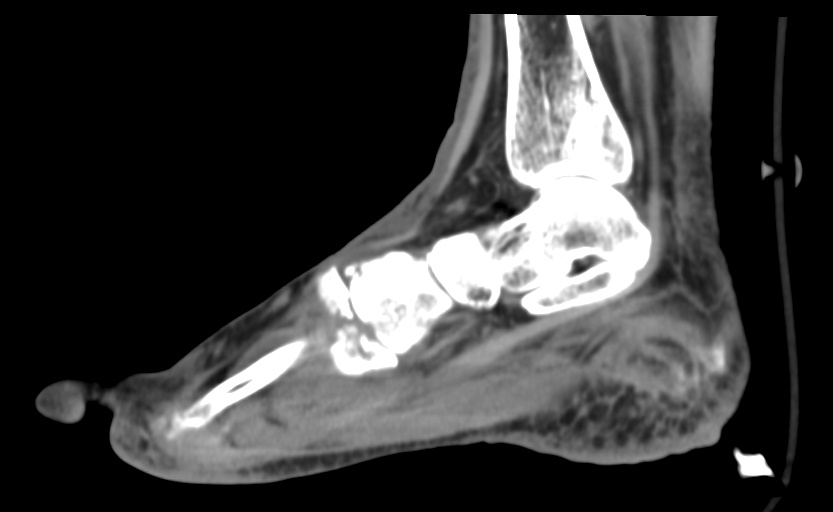
[im 26/51  bone]
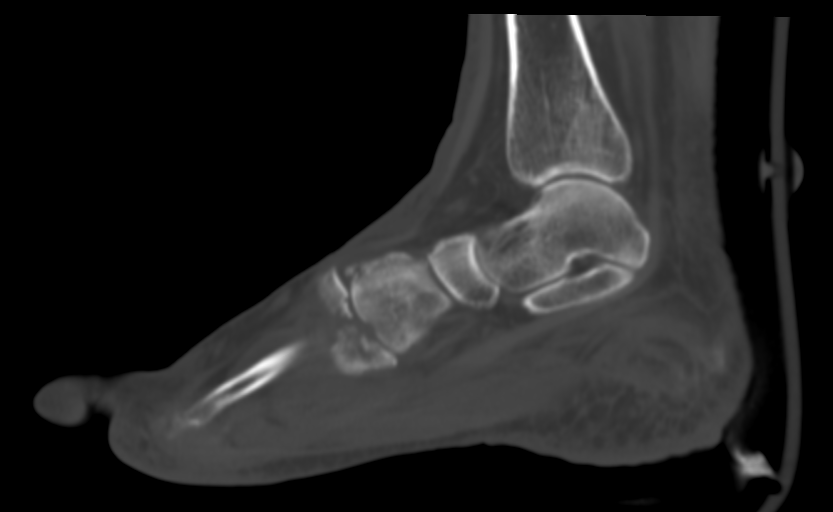
[im 30/51  bone]
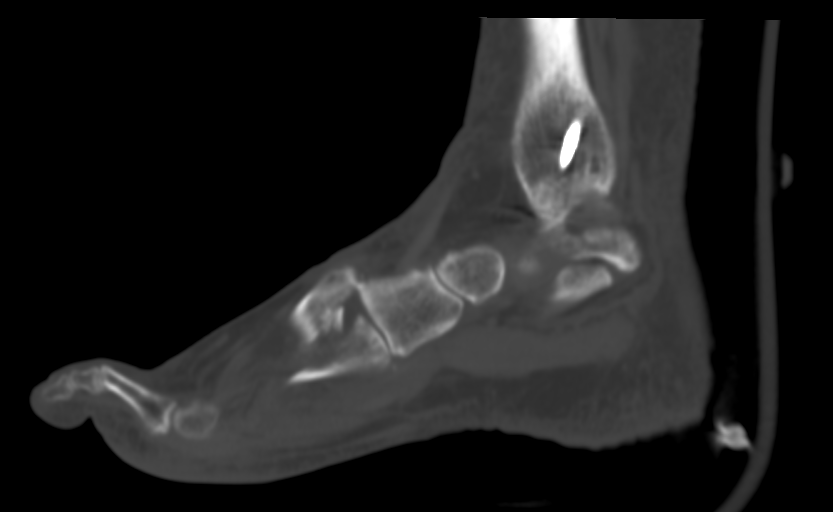
[im 34/51  bone]
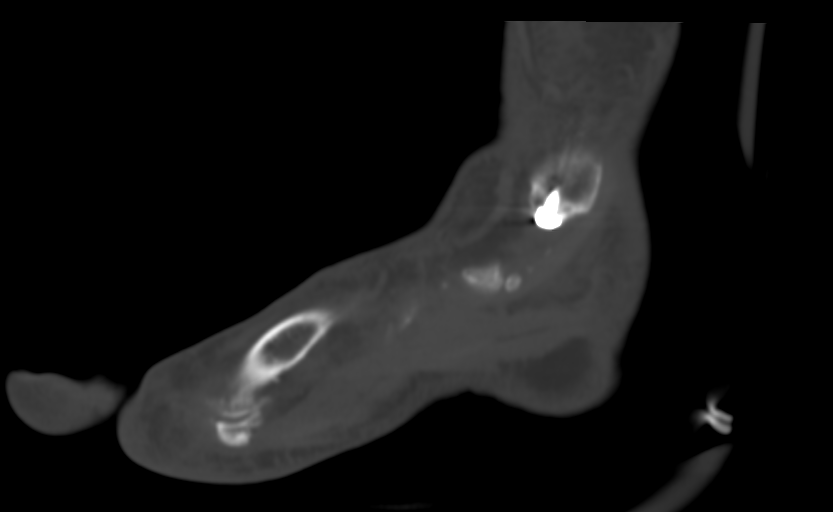

[Series 602: cor bone · axial · 0.52mm/px · z∈[-218,-149]mm · 3 of 74 slices shown, 4 images]
[im 19/74  soft-tissue]
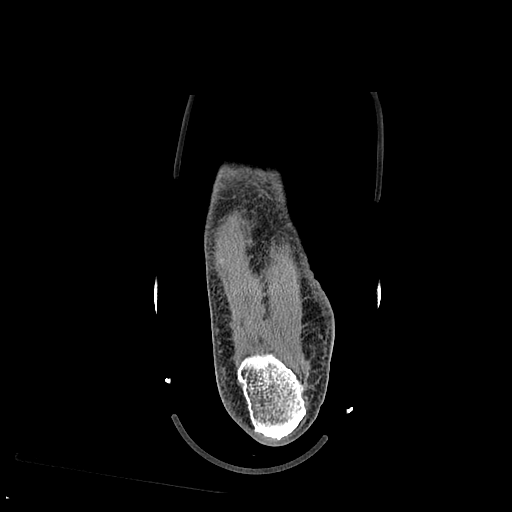
[im 19/74  bone]
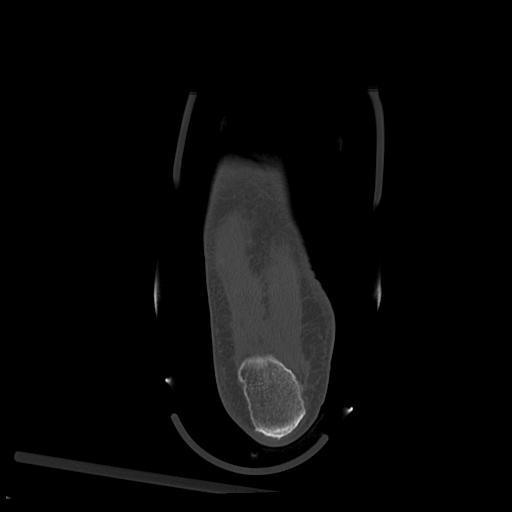
[im 37/74  bone]
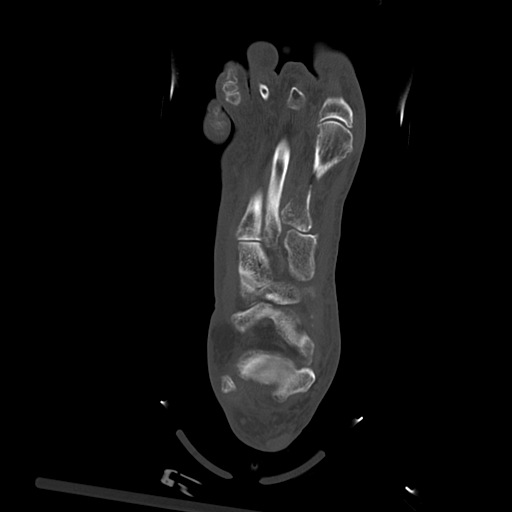
[im 55/74  bone]
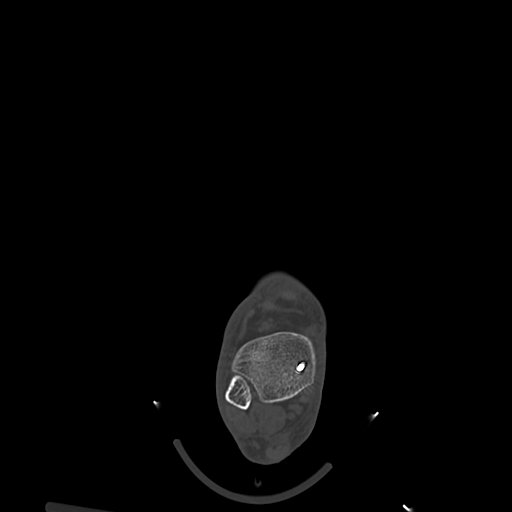

[Series 603: short axis midfoot · axial · 0.52mm/px · z∈[-123,-54]mm · 3 of 74 slices shown]
[im 19/74  bone]
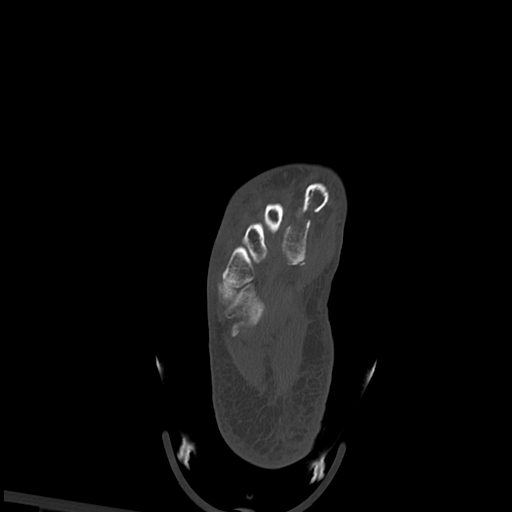
[im 37/74  bone]
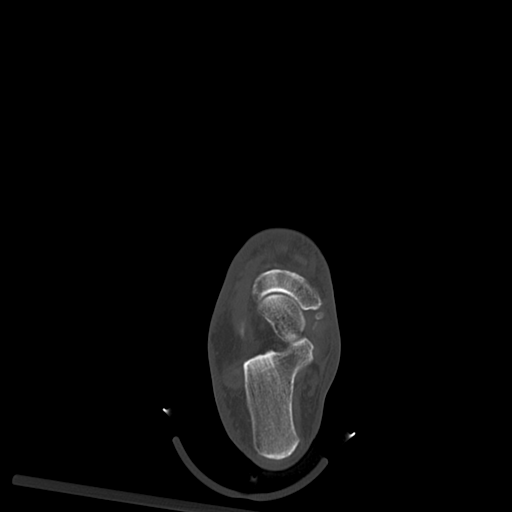
[im 55/74  bone]
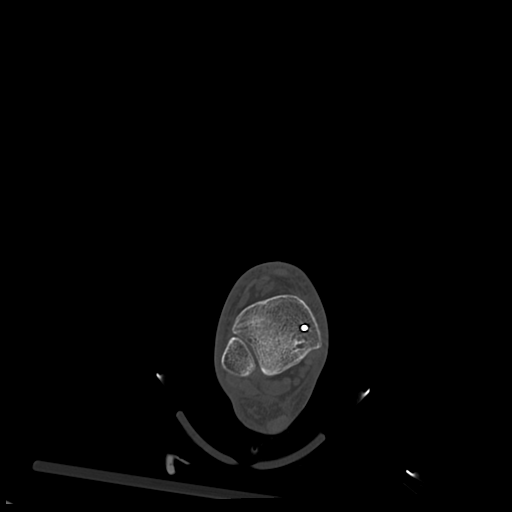

[13 of 27 positions shown; findings below may reference images not displayed]

FINDINGS: There is a complex comminuted intra-articular fracture involving the
first metatarsal. There is a long longitudinal split type fracture
beginning at the base of the first metatarsal and extending up
through the midshaft region with moderate displacement, a maximum of
6.5 mm. There is also a small die punch type component with a
displaced central fragment of the articular surface approximately
5.5 mm.

Associated multiple small fractures involving the distal aspect of
the medial cuneiform.

The first metatarsal is subluxed laterally compared to the medial
cuneiform and the space between the medial cuneiform and second
metatarsal base is slightly widened. This may represent an injury to
the Lisfranc ligament. The base of the second metatarsal is still
lying with the middle cuneiform and the other tarsal metatarsal
joints are maintained.

Advanced degenerative changes at the first MTP joint and mild hallux
valgus deformity.

Evidence of remote medial malleolus fracture with a single
transfixing screw in place.

The tibiotalar and subtalar joints are maintained.

The major ankle tendons and ligaments appear to be grossly intact by
CT.
IMPRESSION: 1. Complex comminuted intra-articular fracture involving the first
metatarsal. Associated fractures involving the distal dorsal aspect
of the medial cuneiform.
2. Lateral subluxation at the first tarsal metatarsal joint and
slight widening of the space between the medial cuneiform and the
base of the second metatarsal which could suggest a Lisfranc
ligament injury.
3. No other fractures are identified.

## 2019-09-09 IMAGING — RF RIGHT FOOT - 2 VIEW
1 series · 2 of 2 positions shown · non-contrast
Comparison: CT 11/01/2018

CLINICAL DATA: ORIF

EXAM:
DG C-ARM 61-120 MIN; RIGHT FOOT - 2 VIEW

[Series 1: run · 2 of 2 slices shown]
[im 1/2]
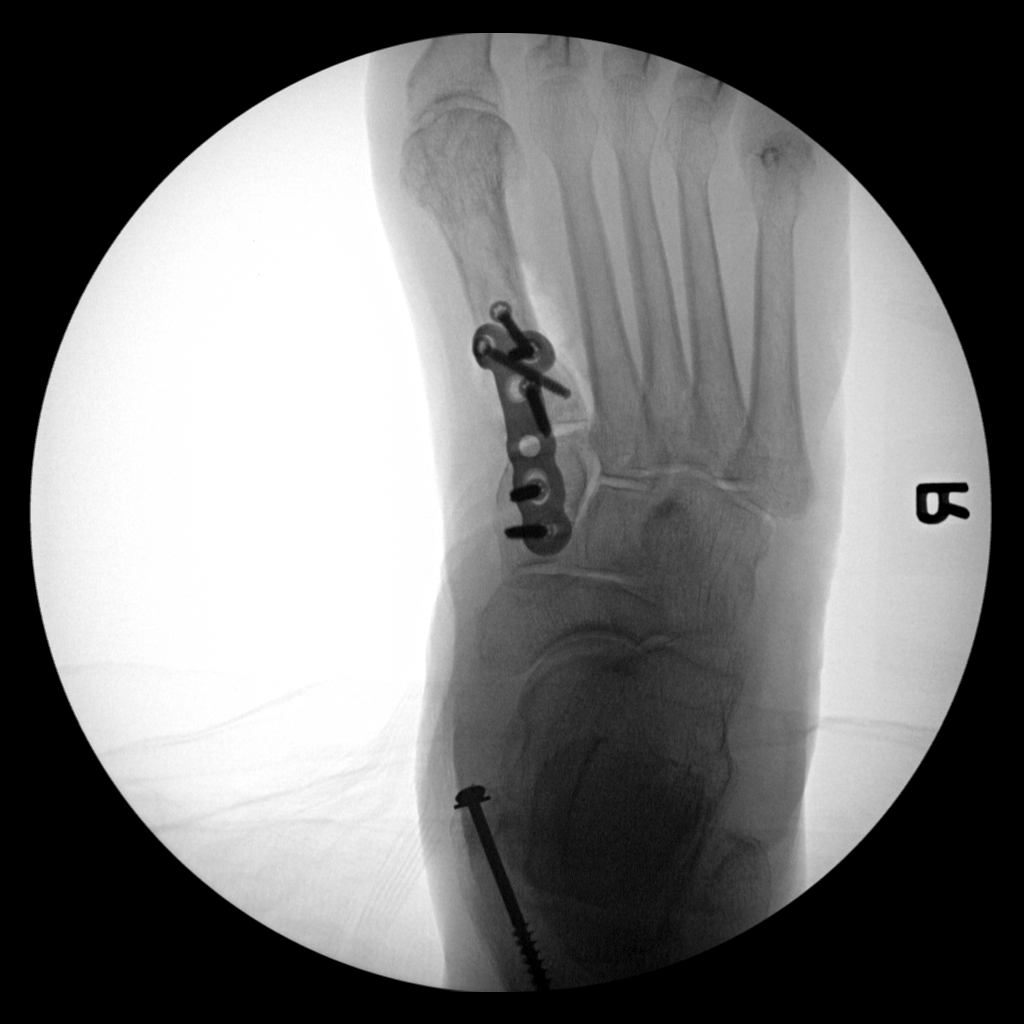
[im 2/2]
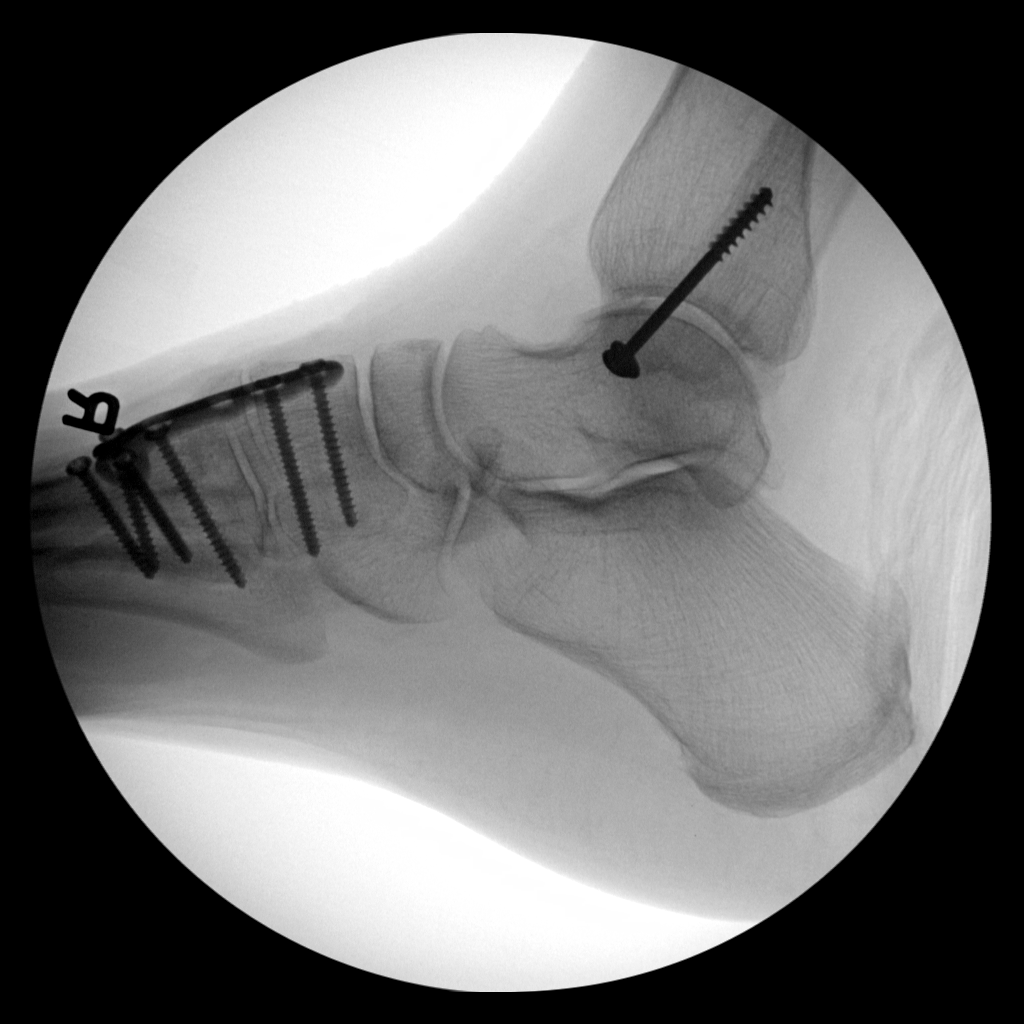

[2 of 2 positions shown; findings below may reference images not displayed]

FINDINGS: Plate and screw fixation across the 1st tarsometatarsal joint
anchored in the medial cuneiform and proximal 1st metatarsal. Remote
medial malleolar screw in place. No hardware bony complicating
feature.
IMPRESSION: Internal fixation across the 1st tarsal metatarsal joint. No
hardware or bony complicating feature visualized.
